# Patient Record
Sex: Male | Born: 1940
Health system: Southern US, Community
[De-identification: ages and names within clinical notes are randomized; demographics above are authoritative.]

## PROBLEM LIST (undated history)

## (undated) DIAGNOSIS — J301 Allergic rhinitis due to pollen: Secondary | ICD-10-CM

## (undated) DIAGNOSIS — N529 Male erectile dysfunction, unspecified: Secondary | ICD-10-CM

## (undated) DIAGNOSIS — R609 Edema, unspecified: Secondary | ICD-10-CM

## (undated) DIAGNOSIS — K635 Polyp of colon: Secondary | ICD-10-CM

## (undated) DIAGNOSIS — H9190 Unspecified hearing loss, unspecified ear: Secondary | ICD-10-CM

## (undated) DIAGNOSIS — I10 Essential (primary) hypertension: Secondary | ICD-10-CM

## (undated) DIAGNOSIS — D649 Anemia, unspecified: Secondary | ICD-10-CM

## (undated) DIAGNOSIS — M1990 Unspecified osteoarthritis, unspecified site: Secondary | ICD-10-CM

## (undated) DIAGNOSIS — E039 Hypothyroidism, unspecified: Secondary | ICD-10-CM

## (undated) DIAGNOSIS — R7989 Other specified abnormal findings of blood chemistry: Secondary | ICD-10-CM

## (undated) DIAGNOSIS — N486 Induration penis plastica: Secondary | ICD-10-CM

## (undated) DIAGNOSIS — N059 Unspecified nephritic syndrome with unspecified morphologic changes: Secondary | ICD-10-CM

## (undated) DIAGNOSIS — D126 Benign neoplasm of colon, unspecified: Secondary | ICD-10-CM

## (undated) DIAGNOSIS — E7800 Pure hypercholesterolemia, unspecified: Secondary | ICD-10-CM

## (undated) DIAGNOSIS — J449 Chronic obstructive pulmonary disease, unspecified: Secondary | ICD-10-CM

## (undated) DIAGNOSIS — J45909 Unspecified asthma, uncomplicated: Secondary | ICD-10-CM

## (undated) DIAGNOSIS — M720 Palmar fascial fibromatosis [Dupuytren]: Secondary | ICD-10-CM

## (undated) DIAGNOSIS — J309 Allergic rhinitis, unspecified: Secondary | ICD-10-CM

## (undated) DIAGNOSIS — M353 Polymyalgia rheumatica: Secondary | ICD-10-CM

## (undated) DIAGNOSIS — N183 Chronic kidney disease, stage 3 unspecified: Secondary | ICD-10-CM

## (undated) DIAGNOSIS — R011 Cardiac murmur, unspecified: Secondary | ICD-10-CM

## (undated) HISTORY — DX: Male erectile dysfunction, unspecified: N52.9

## (undated) HISTORY — PX: CATARACT EXTRACTION: SUR2

## (undated) HISTORY — PX: KNEE ARTHROSCOPY: SUR90

## (undated) HISTORY — PX: ACHILLES TENDON REPAIR: SUR1153

## (undated) HISTORY — DX: Anemia, unspecified: D64.9

## (undated) HISTORY — DX: Edema, unspecified: R60.9

## (undated) HISTORY — DX: Allergic rhinitis due to pollen: J30.1

## (undated) HISTORY — DX: Allergic rhinitis, unspecified: J30.9

## (undated) HISTORY — DX: Induration penis plastica: N48.6

## (undated) HISTORY — DX: Unspecified hearing loss, unspecified ear: H91.90

## (undated) HISTORY — DX: Chronic kidney disease, stage 3 unspecified: N18.30

## (undated) HISTORY — DX: Chronic obstructive pulmonary disease, unspecified: J44.9

## (undated) HISTORY — DX: Polymyalgia rheumatica: M35.3

## (undated) HISTORY — DX: Palmar fascial fibromatosis (dupuytren): M72.0

## (undated) HISTORY — DX: Hypothyroidism, unspecified: E03.9

## (undated) HISTORY — DX: Polyp of colon: K63.5

## (undated) HISTORY — PX: HERNIA REPAIR: SHX51

## (undated) HISTORY — PX: MOUTH SURGERY: SHX715

## (undated) HISTORY — PX: TONSILLECTOMY: SUR1361

## (undated) HISTORY — DX: Benign neoplasm of colon, unspecified: D12.6

## (undated) HISTORY — DX: Pure hypercholesterolemia, unspecified: E78.00

## (undated) HISTORY — DX: Other specified abnormal findings of blood chemistry: R79.89

## (undated) HISTORY — PX: ROTATOR CUFF REPAIR: SHX139

## (undated) HISTORY — PX: OTHER SURGICAL HISTORY: SHX169

---

## 2001-07-01 ENCOUNTER — Encounter (INDEPENDENT_AMBULATORY_CARE_PROVIDER_SITE_OTHER): Payer: Self-pay | Admitting: Specialist

## 2001-07-01 ENCOUNTER — Other Ambulatory Visit: Admission: RE | Admit: 2001-07-01 | Discharge: 2001-07-01 | Payer: Self-pay | Admitting: Gastroenterology

## 2004-06-24 DIAGNOSIS — N486 Induration penis plastica: Secondary | ICD-10-CM

## 2004-06-24 HISTORY — DX: Induration penis plastica: N48.6

## 2004-12-29 ENCOUNTER — Emergency Department (HOSPITAL_COMMUNITY): Admission: EM | Admit: 2004-12-29 | Discharge: 2004-12-29 | Payer: Self-pay | Admitting: Emergency Medicine

## 2005-03-27 ENCOUNTER — Encounter: Admission: RE | Admit: 2005-03-27 | Discharge: 2005-03-27 | Payer: Self-pay | Admitting: Family Medicine

## 2005-06-01 ENCOUNTER — Emergency Department (HOSPITAL_COMMUNITY): Admission: EM | Admit: 2005-06-01 | Discharge: 2005-06-02 | Payer: Self-pay | Admitting: Emergency Medicine

## 2005-06-06 ENCOUNTER — Ambulatory Visit: Payer: Self-pay | Admitting: Pulmonary Disease

## 2005-07-09 ENCOUNTER — Ambulatory Visit: Payer: Self-pay | Admitting: Pulmonary Disease

## 2005-08-08 ENCOUNTER — Ambulatory Visit: Payer: Self-pay | Admitting: Gastroenterology

## 2005-08-20 ENCOUNTER — Ambulatory Visit: Payer: Self-pay | Admitting: Pulmonary Disease

## 2005-09-12 ENCOUNTER — Ambulatory Visit: Payer: Self-pay | Admitting: Pulmonary Disease

## 2005-12-09 ENCOUNTER — Ambulatory Visit: Payer: Self-pay | Admitting: Gastroenterology

## 2006-04-16 ENCOUNTER — Encounter (INDEPENDENT_AMBULATORY_CARE_PROVIDER_SITE_OTHER): Payer: Self-pay | Admitting: *Deleted

## 2006-04-16 ENCOUNTER — Ambulatory Visit (HOSPITAL_COMMUNITY): Admission: RE | Admit: 2006-04-16 | Discharge: 2006-04-17 | Payer: Self-pay | Admitting: Otolaryngology

## 2006-09-07 ENCOUNTER — Emergency Department (HOSPITAL_COMMUNITY): Admission: EM | Admit: 2006-09-07 | Discharge: 2006-09-07 | Payer: Self-pay | Admitting: Emergency Medicine

## 2007-04-29 ENCOUNTER — Encounter (INDEPENDENT_AMBULATORY_CARE_PROVIDER_SITE_OTHER): Payer: Self-pay | Admitting: Family Medicine

## 2007-04-29 ENCOUNTER — Ambulatory Visit (HOSPITAL_COMMUNITY): Admission: RE | Admit: 2007-04-29 | Discharge: 2007-04-29 | Payer: Self-pay | Admitting: Family Medicine

## 2007-04-29 ENCOUNTER — Ambulatory Visit: Payer: Self-pay | Admitting: Vascular Surgery

## 2007-09-17 ENCOUNTER — Encounter: Admission: RE | Admit: 2007-09-17 | Discharge: 2007-09-17 | Payer: Self-pay | Admitting: Allergy and Immunology

## 2007-09-27 ENCOUNTER — Ambulatory Visit: Payer: Self-pay | Admitting: Internal Medicine

## 2007-09-29 LAB — CBC & DIFF AND RETIC
EOS%: 0.4 % (ref 0.0–7.0)
Eosinophils Absolute: 0.1 10*3/uL (ref 0.0–0.5)
IRF: 0.33 (ref 0.070–0.380)
MCH: 30.5 pg (ref 28.0–33.4)
MCV: 87.8 fL (ref 81.6–98.0)
MONO%: 7.4 % (ref 0.0–13.0)
NEUT#: 9.9 10*3/uL — ABNORMAL HIGH (ref 1.5–6.5)
RBC: 4.47 10*6/uL (ref 4.20–5.71)
RDW: 13.3 % (ref 11.2–14.6)
RETIC #: 60.8 10*3/uL (ref 31.8–103.9)
Retic %: 1.4 % (ref 0.7–2.3)
lymph#: 1.6 10*3/uL (ref 0.9–3.3)

## 2007-10-01 LAB — COMPREHENSIVE METABOLIC PANEL
AST: 27 U/L (ref 0–37)
Alkaline Phosphatase: 113 U/L (ref 39–117)
BUN: 25 mg/dL — ABNORMAL HIGH (ref 6–23)
Creatinine, Ser: 1.21 mg/dL (ref 0.40–1.50)

## 2007-10-01 LAB — PROTEIN ELECTROPHORESIS, SERUM
Albumin ELP: 62.2 % (ref 55.8–66.1)
Beta 2: 3.7 % (ref 3.2–6.5)
Beta Globulin: 6.3 % (ref 4.7–7.2)
Gamma Globulin: 10.5 % — ABNORMAL LOW (ref 11.1–18.8)

## 2007-10-01 LAB — FOLATE RBC: RBC Folate: 492 ng/mL (ref 180–600)

## 2007-10-01 LAB — IRON AND TIBC
%SAT: 17 % — ABNORMAL LOW (ref 20–55)
TIBC: 330 ug/dL (ref 215–435)
UIBC: 275 ug/dL

## 2007-10-01 LAB — FERRITIN: Ferritin: 81 ng/mL (ref 22–322)

## 2007-10-07 LAB — CBC WITH DIFFERENTIAL/PLATELET
Basophils Absolute: 0.2 10*3/uL — ABNORMAL HIGH (ref 0.0–0.1)
EOS%: 0.2 % (ref 0.0–7.0)
HGB: 13.9 g/dL (ref 13.0–17.1)
MCH: 30.4 pg (ref 28.0–33.4)
MCV: 88.8 fL (ref 81.6–98.0)
MONO%: 3.2 % (ref 0.0–13.0)
RDW: 13.3 % (ref 11.2–14.6)

## 2010-12-13 ENCOUNTER — Encounter: Payer: Self-pay | Admitting: Gastroenterology

## 2010-12-26 NOTE — Letter (Signed)
Summary: Colonoscopy Letter  [facility]  [address] [place], [place] [address]   Phone: [phone]  Fax: [phone]      December 13, 2010 MRN: [id number]   Shane Cook [address] [place], [place]  [address]   Dear Mr. Spickard,   According to your medical record, it is time for you to schedule a Colonoscopy. The American Cancer Society recommends this procedure as a method to detect early colon cancer. Patients with a family history of colon cancer, or a personal history of colon polyps or inflammatory bowel disease are at increased risk.  This letter has been generated based on the recommendations made at the time of your procedure. If you feel that in your particular situation this may no longer apply, please contact our office.  Please call our office at [phone] to schedule this appointment or to update your records at your earliest convenience.  Thank you for cooperating with [place] to provide you with the very best care possible.   Sincerely,  Rachael Fee, M.D.  [facility] Gastroenterology Division [phone]

## 2011-04-11 NOTE — Op Note (Signed)
NAME:  Shane Cook, Shane Cook              ACCOUNT NO.:  [id number]   MEDICAL RECORD NO.:  [id number]          PATIENT TYPE:  AMB   LOCATION:  SDS                          FACILITY:  [facility]   PHYSICIAN:  Kinnie Scales. Annalee Genta, M.D.DATE OF BIRTH:  09-Oct-1941   DATE OF PROCEDURE:  04/16/2006  DATE OF DISCHARGE:                                 OPERATIVE REPORT   PRE-AND-POSTOPERATIVE DIAGNOSIS AND INDICATIONS FOR SURGERY:  1.  Chronic sinusitis.  2.  Nasal polyposis.  3.  Deviated nasal septum.  4.  Inferior turbinate hypertrophy.   SURGICAL PROCEDURE:  1.  Bilateral endoscopic sinus surgery with the computer-assisted navigation      (Stealth consisting of bilateral total ethmoidectomy, bilateral      maxillary antrostomy with removal of diseased tissue, bilateral nasal      frontal recess exploration and left sphenoidotomy).  2.  Nasal septoplasty.  3.  Bilateral inferior turbinate reduction.   ANESTHESIA:  General endotracheal.   SURGEON:  Kinnie Scales. Annalee Genta, M.D.   COMPLICATIONS:  None.   BLOOD LOSS:  Approximately 200 mL.   DISPOSITION:  The patient transferred to the operating room to recovery room  in stable condition.   BRIEF HISTORY:  Shane Cook is a 70 year old white male who is referred for  evaluation of chronic sinusitis.  The patient has a history of pulmonary  dysfunction and reactive airway disease which is exacerbated by his  underlying sinus infections.  He has been treated with numerous courses of  antibiotics; and after aggressive antibiotic therapy and steroids; he is  referred our office for evaluation.  CT scanning was performed which showed  extensive polypoid disease, obstruction of the ostiomeatal complex, and  bilateral maxillary sinus infections.  The patient had a severely deviated  septum and turbinate hypertrophy.  He was, again, treated with a broad-  spectrum antibiotic for approximately 3 weeks, oral steroids, topical  steroids, and mucolytics; and  despite this aggressive therapy failed to have  any significant clinical improvement in his symptoms.  Followup CT scan  which included Stealth formatted CT scan for intraoperative computer-  assisted navigation was performed; and, again, similar findings with a  severely deviated septum, turbinate hypertrophy, and diffuse mucosal disease  consistent with polyposis involving the ethmoid, frontal, and maxillary  sinuses bilaterally.  Given the patient's history, physical examination and  findings, I recommended that we undertake bilateral endoscopic sinus  surgery.  The risk, benefits, and possible complications of the surgical  procedure were discussed in detail with the patient and his wife; and they  understood and concurred with our plan for surgery which was scheduled as  above.  Prior to surgery, cardiac clearance was obtained from his  cardiologist.  The patient underwent a stress test which was negative.  He  is cleared for surgery; and the operation was scheduled at [facility] OR under general anesthesia as an outpatient with overnight  admission.   SURGICAL PROCEDURE:  The patient brought to the operating room on  04/16/2006, placed in the supine position on the operating table.  General  endotracheal anesthesia  was established without difficulty; and the patient  was adequately anesthetized.  It is noted that he was examined and injected  with a total of 10 mL of 1% lidocaine 1:100,000 solution epinephrine  injected in a submucosal fashion on the lateral nasal wall, uncinate  process, middle turbinate, inferior turbinate, nasal septum, and transoral  sphenopalatine approach.  A total of 10 mL of 1% lidocaine and a 1:100,000  solution epinephrine was injected.  The patient's nose was then packed with  Afrin soaked cottonoid pledgets which were left in place for approximately  10 minutes to allow for vasoconstriction and hemostasis.  Surgical procedure  was begun  with the patient positioned on the operating table, prepped and  draped in a sterile fashion.  The stealth navigation headgear was applied  and anatomic and surgical landmarks were identified and confirmed.  The  device was used throughout the surgery for computer-assisted anatomic  localization.  A 0-degree endoscopy was performed.  The patient was found to  have a severely deviated septum with spurring to the right; and obstruction  of the right nasal cavity.  There was polypoid disease within the middle  meatus bilaterally.   Endoscopic sinus surgery was begun on the patient's left-hand side, using a  0-degree telescope and a straight microdebrider.  The middle turbinate was  gently medialized; and the uncinate process reflected medially.  By using  through cutting backbiting forceps, the uncinate process was resected; and  dissection was then carried out through the ethmoid bulla, and floor of the  ethmoid sinus from anterior to posterior.  There was dense polypoid material  within the ethmoid sinus which was cleared.  Ethmoid bone septations were  resected; and the posterior superior ethmoid sinus was identified.  Using a  45-degree telescope and then dissecting from posterior-to-anterior along the  roof of the ethmoid the diseased mucosa and bony septations were resected.  The nasal frontal recess was identified; and this completely occluded with  polypoid disease which was resected with the curved microdebrider.  The  navigation device was used throughout this portion of the case in order to  ensure safe dissection along the roof of the ethmoid sinus.   Attention was then turned to the lateral nasal wall where residual uncinate  process was resected.  A significant amount of polypoid disease within the  sinus was then resected with a curved microdebrider and the natural ostium  of the maxillary sinus was enlarged in an anterior-inferior posterior direction.  The left sphenoid sinus  was also involved with disease.  There  was significant obstruction at the ostium which was visualized, directly,  from anterior to posterior with the 0-degree telescope.  The posterior  ethmoid air cells were gently palpated.  The posterior aspect of the middle  turbinate was resected as was the inferior aspect of the superior turbinate  creating access to the sphenoethmoid recess.  The natural ostium was  identified and enlarged in an inferior and medial direction in a widely  patent left sphenoid ostium.   Nasal septoplasty was then performed.  A left anterior hemitransfixion  incision was created and a mucoperichondrial flap was elevated from anterior  to posterior on the patient's left-hand side.  Bony cartilaginous junction  was crossed in the midline and a mucoperiosteal flap was elevated along the  right.  The deviated bone and cartilage in the mi-and-posterior aspect of  the nasal septum were then resected.  The mid septal cartilage removed and  preserved.  These were returned to the mucoperichondrial pocket at the  conclusion of the surgical procedure.  Posterior deviated septum and a large  inferior septal bone spur were resected with a 4-mm osteotome.  Morselized  cartilage returned to the mucoperichondrial pocket and the flaps were  reapproximated with a 4-0 gut suture on a Keith needle in a horizontal  mattressing fashion.  At the conclusion of the surgical procedure, bilateral  Doyle nasal septal splints were placed after the application of Bactroban  ointment and sutured in position with a 3-0 Ethilon suture.   Right endoscopic sinus surgery was then undertaken.  The middle turbinate  was gently medialized.  A 0-degree scope was used to resect the uncinate  process with a through cutting backbiting forceps.  The anterior ethmoid  cells were identified and dissection was carried from anterior to posterior  through the ethmoid bulla, and the floor of the posterior ethmoid  region.  Again, there was significant polypoid disease in this location.  Dissection  then carried along with the roof of the ethmoid sinus with a 45-degree  telescope using a microdebrider and the stealth device, preserving the bony  lamina and resecting septations and diseased mucosa.  The nasal frontal  recess was identified under direct visualization using the stealth device.  The ostium was narrow; and there was significant polypoid disease, but using  the stealth and a microdebrider we were able to establish patency in the  right frontal sinus.   Attention was then turned to the lateral nasal wall where the natural ostium  of the maxillary sinus was completely occluded with diseased mucosa.  This  was resected with through cutting forceps and the maxillary sinus was  entered.  The ostium was enlarged in an anterior, inferior, and posterior  direction.  Thick mucus was removed from the posterior ethmoid region and sent to the laboratory for analysis for allergic fungal mucin.   Inferior turbinate reduction was then performed with the bipolar intramural  cautery set at 12 watts.  Two submucosal passes were made in each inferior  turbinate and the turbinates were adequately cauterized or outfractured to  make a more patent nasal cavity.  The anterior-inferior aspect of each  inferior turbinate was then partially resected, preserving the overlying  mucosa and removing the anterior turbinate bone.   The patient's nasal cavity was irrigated and suctioned.  The 0-degree  telescope was used to inspect front to back in each sinus and surgical  debris was resected; creating a widely patent and clean sinus cavity with no  active bleeding.  A 50/50 mix of Kenalog 40 and Bactroban cream was then  instilled into each of the sinuses, frontal ethmoid and maxillary sinuses  bilaterally; and bilateral Kennedy sinus packs were placed in the common  ethmoid cavity bilaterally; and hydrated with  sterile saline.  The patient's  nasal cavity and nasopharynx were irrigated and suctioned.  Orogastric tube  was passed and the stomach contents were aspirated.  The patient was  awakened from his anesthetic, extubated; and was then transferred from the  operating room to the recovery in stable condition.  No complications.  Blood loss approximately 200 mL.           ______________________________  Kinnie Scales. Annalee Genta, M.D.     DLS/MEDQ  D:  96/02/5408  T:  04/16/2006  Job:  [id number]

## 2011-12-26 DIAGNOSIS — H35379 Puckering of macula, unspecified eye: Secondary | ICD-10-CM | POA: Diagnosis not present

## 2011-12-26 DIAGNOSIS — H01009 Unspecified blepharitis unspecified eye, unspecified eyelid: Secondary | ICD-10-CM | POA: Diagnosis not present

## 2012-02-05 DIAGNOSIS — I10 Essential (primary) hypertension: Secondary | ICD-10-CM | POA: Diagnosis not present

## 2012-02-05 DIAGNOSIS — E7800 Pure hypercholesterolemia, unspecified: Secondary | ICD-10-CM | POA: Diagnosis not present

## 2012-02-05 DIAGNOSIS — M353 Polymyalgia rheumatica: Secondary | ICD-10-CM | POA: Diagnosis not present

## 2012-02-09 DIAGNOSIS — Z0000 Encounter for general adult medical examination without abnormal findings: Secondary | ICD-10-CM | POA: Diagnosis not present

## 2012-02-09 DIAGNOSIS — E7800 Pure hypercholesterolemia, unspecified: Secondary | ICD-10-CM | POA: Diagnosis not present

## 2012-02-09 DIAGNOSIS — Z79899 Other long term (current) drug therapy: Secondary | ICD-10-CM | POA: Diagnosis not present

## 2012-03-31 DIAGNOSIS — R972 Elevated prostate specific antigen [PSA]: Secondary | ICD-10-CM | POA: Diagnosis not present

## 2012-03-31 DIAGNOSIS — E291 Testicular hypofunction: Secondary | ICD-10-CM | POA: Diagnosis not present

## 2012-04-07 DIAGNOSIS — E291 Testicular hypofunction: Secondary | ICD-10-CM | POA: Diagnosis not present

## 2012-04-07 DIAGNOSIS — R972 Elevated prostate specific antigen [PSA]: Secondary | ICD-10-CM | POA: Diagnosis not present

## 2012-04-07 DIAGNOSIS — N486 Induration penis plastica: Secondary | ICD-10-CM | POA: Diagnosis not present

## 2012-04-07 DIAGNOSIS — N529 Male erectile dysfunction, unspecified: Secondary | ICD-10-CM | POA: Diagnosis not present

## 2012-04-21 DIAGNOSIS — E291 Testicular hypofunction: Secondary | ICD-10-CM | POA: Diagnosis not present

## 2012-05-05 DIAGNOSIS — Z0000 Encounter for general adult medical examination without abnormal findings: Secondary | ICD-10-CM | POA: Diagnosis not present

## 2012-05-05 DIAGNOSIS — Z79899 Other long term (current) drug therapy: Secondary | ICD-10-CM | POA: Diagnosis not present

## 2012-05-05 DIAGNOSIS — E7800 Pure hypercholesterolemia, unspecified: Secondary | ICD-10-CM | POA: Diagnosis not present

## 2012-05-11 DIAGNOSIS — I10 Essential (primary) hypertension: Secondary | ICD-10-CM | POA: Diagnosis not present

## 2012-05-11 DIAGNOSIS — R7301 Impaired fasting glucose: Secondary | ICD-10-CM | POA: Diagnosis not present

## 2012-05-11 DIAGNOSIS — E7800 Pure hypercholesterolemia, unspecified: Secondary | ICD-10-CM | POA: Diagnosis not present

## 2012-05-11 DIAGNOSIS — D649 Anemia, unspecified: Secondary | ICD-10-CM | POA: Diagnosis not present

## 2012-05-21 DIAGNOSIS — E291 Testicular hypofunction: Secondary | ICD-10-CM | POA: Diagnosis not present

## 2012-08-06 ENCOUNTER — Encounter: Payer: Self-pay | Admitting: Gastroenterology

## 2012-08-11 DIAGNOSIS — E291 Testicular hypofunction: Secondary | ICD-10-CM | POA: Diagnosis not present

## 2012-08-13 ENCOUNTER — Encounter: Payer: Self-pay | Admitting: Gastroenterology

## 2012-08-20 ENCOUNTER — Encounter: Payer: Self-pay | Admitting: Gastroenterology

## 2012-08-27 ENCOUNTER — Ambulatory Visit (AMBULATORY_SURGERY_CENTER): Payer: Medicare Other

## 2012-08-27 VITALS — Ht 70.0 in | Wt 200.0 lb

## 2012-08-27 DIAGNOSIS — Z860100 Personal history of colon polyps, unspecified: Secondary | ICD-10-CM

## 2012-08-27 MED ORDER — MOVIPREP 100 G PO SOLR: 1.0000 | Freq: Once | ORAL | Status: DC

## 2012-09-01 DIAGNOSIS — J45901 Unspecified asthma with (acute) exacerbation: Secondary | ICD-10-CM | POA: Diagnosis not present

## 2012-09-01 DIAGNOSIS — J310 Chronic rhinitis: Secondary | ICD-10-CM | POA: Diagnosis not present

## 2012-09-01 DIAGNOSIS — J441 Chronic obstructive pulmonary disease with (acute) exacerbation: Secondary | ICD-10-CM | POA: Diagnosis not present

## 2012-09-01 DIAGNOSIS — J328 Other chronic sinusitis: Secondary | ICD-10-CM | POA: Diagnosis not present

## 2012-09-03 ENCOUNTER — Ambulatory Visit (AMBULATORY_SURGERY_CENTER): Payer: Medicare Other | Admitting: Gastroenterology

## 2012-09-03 ENCOUNTER — Encounter: Payer: Self-pay | Admitting: Gastroenterology

## 2012-09-03 VITALS — BP 121/70 | HR 65 | Temp 98.5°F | Resp 36 | Ht 70.0 in | Wt 200.0 lb

## 2012-09-03 DIAGNOSIS — K5730 Diverticulosis of large intestine without perforation or abscess without bleeding: Secondary | ICD-10-CM

## 2012-09-03 DIAGNOSIS — Z86010 Personal history of colonic polyps: Secondary | ICD-10-CM

## 2012-09-03 MED ORDER — SODIUM CHLORIDE 0.9 % IV SOLN: 500.0000 mL | INTRAVENOUS | Status: DC

## 2012-09-03 NOTE — Op Note (Signed)
[facility] [address] [place], [address]   COLONOSCOPY PROCEDURE REPORT  PATIENT: [name], Shane [place].  MR#: [id number] BIRTHDATE: [birth year] , 71  yrs. old GENDER: Male ENDOSCOPIST: Rachael Fee, MD PROCEDURE DATE:  09/03/2012 PROCEDURE:   Colonoscopy, diagnostic ASA CLASS:   Class III INDICATIONS:patient's personal history of adenomatous colon polyps.  MEDICATIONS: Fentanyl 50 mcg IV and Versed 5 mg IV  DESCRIPTION OF PROCEDURE:   After the risks benefits and alternatives of the procedure were thoroughly explained, informed consent was obtained.  A digital rectal exam revealed no abnormalities of the rectum.   The LB CF-[id number]  endoscope was introduced through the anus and advanced to the cecum, which was identified by both the appendix and ileocecal valve. No adverse events experienced.   The quality of the prep was good, using MoviPrep  The instrument was then slowly withdrawn as the colon was fully examined.   COLON FINDINGS: Mild diverticulosis was noted in the sigmoid colon. Retroflexed views revealed no abnormalities. The time to cecum=1 minutes 07 seconds.  Withdrawal time=8 minutes 53 seconds.  The scope was withdrawn and the procedure completed. COMPLICATIONS: There were no complications.  ENDOSCOPIC IMPRESSION: Mild diverticulosis was noted in the sigmoid colon No polyps or cancers  RECOMMENDATIONS: Given your personal history of adenomatous (pre-cancerous) polyps, you will need a repeat colonoscopy in 5 years.   eSigned:  Rachael Fee, MD 09/03/2012 2:27 PM    cc: Duane Lope, MD

## 2012-09-03 NOTE — Progress Notes (Signed)
Patient did not experience any of the following events: a burn prior to discharge; a fall within the facility; wrong site/side/patient/procedure/implant event; or a hospital transfer or hospital admission upon discharge from the facility. (G8907) Patient did not have preoperative order for IV antibiotic SSI prophylaxis. (G8918)  

## 2012-09-03 NOTE — Patient Instructions (Addendum)
Discharge instructions given with verbal understanding. Handouts on diverticulosis and a high fiber diet given. Resume previous medications.YOU HAD AN ENDOSCOPIC PROCEDURE TODAY AT THE [facility]: Refer to the procedure report that was given to you for any specific questions about what was found during the examination.  If the procedure report does not answer your questions, please call your gastroenterologist to clarify.  If you requested that your care partner not be given the details of your procedure findings, then the procedure report has been included in a sealed envelope for you to review at your convenience later.  YOU SHOULD EXPECT: Some feelings of bloating in the abdomen. Passage of more gas than usual.  Walking can help get rid of the air that was put into your GI tract during the procedure and reduce the bloating. If you had a lower endoscopy (such as a colonoscopy or flexible sigmoidoscopy) you may notice spotting of blood in your stool or on the toilet paper. If you underwent a bowel prep for your procedure, then you may not have a normal bowel movement for a few days.  DIET: Your first meal following the procedure should be a light meal and then it is ok to progress to your normal diet.  A half-sandwich or bowl of soup is an example of a good first meal.  Heavy or fried foods are harder to digest and may make you feel nauseous or bloated.  Likewise meals heavy in dairy and vegetables can cause extra gas to form and this can also increase the bloating.  Drink plenty of fluids but you should avoid alcoholic beverages for 24 hours.  ACTIVITY: Your care partner should take you home directly after the procedure.  You should plan to take it easy, moving slowly for the rest of the day.  You can resume normal activity the day after the procedure however you should NOT DRIVE or use heavy machinery for 24 hours (because of the sedation medicines used during the test).    SYMPTOMS TO  REPORT IMMEDIATELY: A gastroenterologist can be reached at any hour.  During normal business hours, 8:30 AM to 5:00 PM Monday through Friday, call [phone].  After hours and on weekends, please call the GI answering service at [phone] who will take a message and have the physician on call contact you.   Following lower endoscopy (colonoscopy or flexible sigmoidoscopy):  Excessive amounts of blood in the stool  Significant tenderness or worsening of abdominal pains  Swelling of the abdomen that is new, acute  Fever of 100F or higher  FOLLOW UP: If any biopsies were taken you will be contacted by phone or by letter within the next 1-3 weeks.  Call your gastroenterologist if you have not heard about the biopsies in 3 weeks.  Our staff will call the home number listed on your records the next business day following your procedure to check on you and address any questions or concerns that you may have at that time regarding the information given to you following your procedure. This is a courtesy call and so if there is no answer at the home number and we have not heard from you through the emergency physician on call, we will assume that you have returned to your regular daily activities without incident.  SIGNATURES/CONFIDENTIALITY: You and/or your care partner have signed paperwork which will be entered into your electronic medical record.  These signatures attest to the fact that that the information above on your   After Visit Summary has been reviewed and is understood.  Full responsibility of the confidentiality of this discharge information lies with you and/or your care-partner.   

## 2012-09-06 ENCOUNTER — Telehealth: Payer: Self-pay

## 2012-09-06 DIAGNOSIS — H02109 Unspecified ectropion of unspecified eye, unspecified eyelid: Secondary | ICD-10-CM | POA: Diagnosis not present

## 2012-09-06 DIAGNOSIS — H04209 Unspecified epiphora, unspecified lacrimal gland: Secondary | ICD-10-CM | POA: Diagnosis not present

## 2012-09-06 DIAGNOSIS — H0489 Other disorders of lacrimal system: Secondary | ICD-10-CM | POA: Diagnosis not present

## 2012-09-06 DIAGNOSIS — H0289 Other specified disorders of eyelid: Secondary | ICD-10-CM | POA: Diagnosis not present

## 2012-09-06 NOTE — Telephone Encounter (Signed)
Left message

## 2012-09-24 ENCOUNTER — Other Ambulatory Visit: Payer: Self-pay | Admitting: Gastroenterology

## 2012-09-28 DIAGNOSIS — E349 Endocrine disorder, unspecified: Secondary | ICD-10-CM

## 2012-09-28 DIAGNOSIS — E785 Hyperlipidemia, unspecified: Secondary | ICD-10-CM

## 2012-09-28 DIAGNOSIS — I10 Essential (primary) hypertension: Secondary | ICD-10-CM

## 2012-09-28 DIAGNOSIS — M1990 Unspecified osteoarthritis, unspecified site: Secondary | ICD-10-CM

## 2012-09-28 HISTORY — DX: Unspecified osteoarthritis, unspecified site: M19.90

## 2012-09-28 HISTORY — DX: Hyperlipidemia, unspecified: E78.5

## 2012-09-28 HISTORY — DX: Essential (primary) hypertension: I10

## 2012-09-28 HISTORY — DX: Endocrine disorder, unspecified: E34.9

## 2012-10-11 DIAGNOSIS — H02109 Unspecified ectropion of unspecified eye, unspecified eyelid: Secondary | ICD-10-CM | POA: Diagnosis not present

## 2012-10-11 DIAGNOSIS — H04209 Unspecified epiphora, unspecified lacrimal gland: Secondary | ICD-10-CM | POA: Diagnosis not present

## 2012-10-19 DIAGNOSIS — H02109 Unspecified ectropion of unspecified eye, unspecified eyelid: Secondary | ICD-10-CM | POA: Insufficient documentation

## 2012-10-19 HISTORY — DX: Unspecified ectropion of unspecified eye, unspecified eyelid: H02.109

## 2012-11-03 DIAGNOSIS — D649 Anemia, unspecified: Secondary | ICD-10-CM | POA: Diagnosis not present

## 2012-11-03 DIAGNOSIS — I10 Essential (primary) hypertension: Secondary | ICD-10-CM | POA: Diagnosis not present

## 2012-11-03 DIAGNOSIS — E7800 Pure hypercholesterolemia, unspecified: Secondary | ICD-10-CM | POA: Diagnosis not present

## 2012-11-03 DIAGNOSIS — R7301 Impaired fasting glucose: Secondary | ICD-10-CM | POA: Diagnosis not present

## 2012-11-10 DIAGNOSIS — I10 Essential (primary) hypertension: Secondary | ICD-10-CM | POA: Diagnosis not present

## 2012-11-10 DIAGNOSIS — E7800 Pure hypercholesterolemia, unspecified: Secondary | ICD-10-CM | POA: Diagnosis not present

## 2012-11-10 DIAGNOSIS — R7301 Impaired fasting glucose: Secondary | ICD-10-CM | POA: Diagnosis not present

## 2012-11-22 DIAGNOSIS — R972 Elevated prostate specific antigen [PSA]: Secondary | ICD-10-CM | POA: Diagnosis not present

## 2012-11-29 DIAGNOSIS — R972 Elevated prostate specific antigen [PSA]: Secondary | ICD-10-CM | POA: Diagnosis not present

## 2012-11-29 DIAGNOSIS — N529 Male erectile dysfunction, unspecified: Secondary | ICD-10-CM | POA: Diagnosis not present

## 2012-11-29 DIAGNOSIS — E291 Testicular hypofunction: Secondary | ICD-10-CM | POA: Diagnosis not present

## 2012-11-29 DIAGNOSIS — N486 Induration penis plastica: Secondary | ICD-10-CM | POA: Diagnosis not present

## 2012-11-30 DIAGNOSIS — H02409 Unspecified ptosis of unspecified eyelid: Secondary | ICD-10-CM | POA: Diagnosis not present

## 2012-11-30 DIAGNOSIS — Z9889 Other specified postprocedural states: Secondary | ICD-10-CM | POA: Diagnosis not present

## 2012-11-30 DIAGNOSIS — H0230 Blepharochalasis unspecified eye, unspecified eyelid: Secondary | ICD-10-CM | POA: Diagnosis not present

## 2012-11-30 DIAGNOSIS — Z09 Encounter for follow-up examination after completed treatment for conditions other than malignant neoplasm: Secondary | ICD-10-CM | POA: Diagnosis not present

## 2012-11-30 HISTORY — DX: Blepharochalasis unspecified eye, unspecified eyelid: H02.30

## 2012-11-30 HISTORY — DX: Unspecified ptosis of unspecified eyelid: H02.409

## 2013-02-02 DIAGNOSIS — R7301 Impaired fasting glucose: Secondary | ICD-10-CM | POA: Diagnosis not present

## 2013-02-02 DIAGNOSIS — I10 Essential (primary) hypertension: Secondary | ICD-10-CM | POA: Diagnosis not present

## 2013-02-02 DIAGNOSIS — E7800 Pure hypercholesterolemia, unspecified: Secondary | ICD-10-CM | POA: Diagnosis not present

## 2013-02-09 DIAGNOSIS — Z0000 Encounter for general adult medical examination without abnormal findings: Secondary | ICD-10-CM | POA: Diagnosis not present

## 2013-02-09 DIAGNOSIS — R413 Other amnesia: Secondary | ICD-10-CM | POA: Diagnosis not present

## 2013-02-09 DIAGNOSIS — I10 Essential (primary) hypertension: Secondary | ICD-10-CM | POA: Diagnosis not present

## 2013-02-09 DIAGNOSIS — E7800 Pure hypercholesterolemia, unspecified: Secondary | ICD-10-CM | POA: Diagnosis not present

## 2013-02-18 DIAGNOSIS — M25569 Pain in unspecified knee: Secondary | ICD-10-CM | POA: Diagnosis not present

## 2013-03-04 DIAGNOSIS — E291 Testicular hypofunction: Secondary | ICD-10-CM | POA: Diagnosis not present

## 2013-03-08 DIAGNOSIS — J328 Other chronic sinusitis: Secondary | ICD-10-CM | POA: Diagnosis not present

## 2013-03-08 DIAGNOSIS — J310 Chronic rhinitis: Secondary | ICD-10-CM | POA: Diagnosis not present

## 2013-03-08 DIAGNOSIS — J45901 Unspecified asthma with (acute) exacerbation: Secondary | ICD-10-CM | POA: Diagnosis not present

## 2013-03-08 DIAGNOSIS — J441 Chronic obstructive pulmonary disease with (acute) exacerbation: Secondary | ICD-10-CM | POA: Diagnosis not present

## 2013-04-11 DIAGNOSIS — E291 Testicular hypofunction: Secondary | ICD-10-CM | POA: Diagnosis not present

## 2013-05-23 DIAGNOSIS — E291 Testicular hypofunction: Secondary | ICD-10-CM | POA: Diagnosis not present

## 2013-05-23 DIAGNOSIS — R972 Elevated prostate specific antigen [PSA]: Secondary | ICD-10-CM | POA: Diagnosis not present

## 2013-05-30 DIAGNOSIS — N529 Male erectile dysfunction, unspecified: Secondary | ICD-10-CM | POA: Diagnosis not present

## 2013-05-30 DIAGNOSIS — E291 Testicular hypofunction: Secondary | ICD-10-CM | POA: Diagnosis not present

## 2013-07-18 DIAGNOSIS — M25569 Pain in unspecified knee: Secondary | ICD-10-CM | POA: Diagnosis not present

## 2013-08-15 ENCOUNTER — Other Ambulatory Visit: Payer: Self-pay | Admitting: Orthopaedic Surgery

## 2013-08-18 ENCOUNTER — Encounter (HOSPITAL_COMMUNITY): Payer: Self-pay | Admitting: Pharmacy Technician

## 2013-08-23 ENCOUNTER — Encounter (HOSPITAL_COMMUNITY): Payer: Self-pay

## 2013-08-23 ENCOUNTER — Encounter (HOSPITAL_COMMUNITY)
Admission: RE | Admit: 2013-08-23 | Discharge: 2013-08-23 | Disposition: A | Discharge status: Home / Self Care | Payer: Medicare Other | Source: Ambulatory Visit | Attending: Orthopaedic Surgery | Admitting: Orthopaedic Surgery

## 2013-08-23 DIAGNOSIS — Z01810 Encounter for preprocedural cardiovascular examination: Secondary | ICD-10-CM | POA: Insufficient documentation

## 2013-08-23 DIAGNOSIS — Z01812 Encounter for preprocedural laboratory examination: Secondary | ICD-10-CM | POA: Diagnosis not present

## 2013-08-23 DIAGNOSIS — Z01818 Encounter for other preprocedural examination: Secondary | ICD-10-CM | POA: Insufficient documentation

## 2013-08-23 HISTORY — DX: Unspecified osteoarthritis, unspecified site: M19.90

## 2013-08-23 HISTORY — DX: Cardiac murmur, unspecified: R01.1

## 2013-08-23 HISTORY — DX: Unspecified nephritic syndrome with unspecified morphologic changes: N05.9

## 2013-08-23 HISTORY — DX: Unspecified asthma, uncomplicated: J45.909

## 2013-08-23 HISTORY — DX: Essential (primary) hypertension: I10

## 2013-08-23 LAB — CBC WITH DIFFERENTIAL/PLATELET
Basophils Absolute: 0 10*3/uL (ref 0.0–0.1)
Basophils Relative: 0 % (ref 0–1)
Eosinophils Absolute: 0.3 10*3/uL (ref 0.0–0.7)
Eosinophils Relative: 4 % (ref 0–5)
HCT: 51.8 % (ref 39.0–52.0)
Hemoglobin: 18.1 g/dL — ABNORMAL HIGH (ref 13.0–17.0)
MCH: 31.6 pg (ref 26.0–34.0)
MCHC: 34.9 g/dL (ref 30.0–36.0)
MCV: 90.4 fL (ref 78.0–100.0)
Monocytes Absolute: 0.8 10*3/uL (ref 0.1–1.0)
Monocytes Relative: 11 % (ref 3–12)
Neutro Abs: 5 10*3/uL (ref 1.7–7.7)
RDW: 13.7 % (ref 11.5–15.5)
WBC: 7.9 10*3/uL (ref 4.0–10.5)

## 2013-08-23 LAB — URINALYSIS, ROUTINE W REFLEX MICROSCOPIC
Bilirubin Urine: NEGATIVE
Glucose, UA: NEGATIVE mg/dL
Hgb urine dipstick: NEGATIVE
Leukocytes, UA: NEGATIVE
Protein, ur: NEGATIVE mg/dL
Specific Gravity, Urine: 1.008 (ref 1.005–1.030)
Urobilinogen, UA: 0.2 mg/dL (ref 0.0–1.0)
pH: 7 (ref 5.0–8.0)

## 2013-08-23 LAB — BASIC METABOLIC PANEL
BUN: 16 mg/dL (ref 6–23)
CO2: 31 mEq/L (ref 19–32)
Chloride: 100 mEq/L (ref 96–112)
Creatinine, Ser: 1.3 mg/dL (ref 0.50–1.35)
GFR calc Af Amer: 61 mL/min — ABNORMAL LOW (ref >90)
Glucose, Bld: 99 mg/dL (ref 70–99)
Sodium: 138 mEq/L (ref 135–145)

## 2013-08-23 LAB — APTT: aPTT: 30 seconds (ref 24–37)

## 2013-08-23 LAB — TYPE AND SCREEN: ABO/RH(D): O NEG

## 2013-08-23 LAB — ABO/RH: ABO/RH(D): O NEG

## 2013-08-23 LAB — SURGICAL PCR SCREEN
MRSA, PCR: NEGATIVE
Staphylococcus aureus: NEGATIVE

## 2013-08-23 NOTE — Pre-Procedure Instructions (Addendum)
[facility] Jorge Barno  08/23/2013   Your procedure is scheduled on:  08/30/13  Report to [facility] short stay admitting at 530* AM.  Call this number if you have problems the morning of surgery: [phone]   Remember:   Do not eat food or drink liquids after midnight.   Take these medicines the morning of surgery with A SIP OF WATER: inhaler if needed   Do not wear jewelry, make-up or nail polish.  Do not wear lotions, powders, or perfumes. You may wear deodorant.  Do not shave 48 hours prior to surgery. Men may shave face and neck.  Do not bring valuables to the hospital.  [facility] is not responsible                  for any belongings or valuables.               Contacts, dentures or bridgework may not be worn into surgery.  Leave suitcase in the car. After surgery it may be brought to your room.  For patients admitted to the hospital, discharge time is determined by your                treatment team.               Patients discharged the day of surgery will not be allowed to drive  home.  Name and phone number of your driver:   Special Instructions: Incentive Spirometry - Practice and bring it with you on the day of surgery. Shower using CHG 2 nights before surgery and the night before surgery.  If you shower the day of surgery use CHG.  Use special wash - you have one bottle of CHG for all showers.  You should use approximately 1/3 of the bottle for each shower.   Please read over the following fact sheets that you were given: Pain Booklet, Coughing and Deep Breathing, Blood Transfusion Information, Total Joint Packet, MRSA Information and Surgical Site Infection Prevention

## 2013-08-26 NOTE — H&P (Signed)
TOTAL KNEE ADMISSION H&P  Patient is being admitted for left total knee arthroplasty.  Subjective:  Chief Complaint:left knee pain.  HPI: Shane Cook, 72 y.o. male, has a history of pain and functional disability in the left knee due to arthritis and has failed non-surgical conservative treatments for greater than 12 weeks to includecorticosteriod injections, flexibility and strengthening excercises, supervised PT with diminished ADL's post treatment, weight reduction as appropriate and activity modification.  Onset of symptoms was gradual, starting 10 years ago with gradually worsening course since that time. The patient noted prior procedures on the knee to include  arthroscopy on the left knee(s).  Patient currently rates pain in the left knee(s) at 9 out of 10 with activity. Patient has night pain, worsening of pain with activity and weight bearing, pain that interferes with activities of daily living and pain with passive range of motion.  Patient has evidence of subchondral sclerosis, periarticular osteophytes and joint space narrowing by imaging studies. This patient has had previous knee arthroscopy. There is no active infection.  There are no active problems to display for this patient.  Past Medical History  Diagnosis Date  . Hypercholesterolemia   . Low testosterone   . Edema     takes hydrochlorothiazide  . Hypertension   . Asthma     no problems recently  . Heart murmur     hx yrs ago  . Nephritis     72 yrs old  hospitalized. no problem since  . Arthritis     Past Surgical History  Procedure Laterality Date  . Hernia repair    . Achilles tendon repair Right   . Mouth surgery      cyst 72 yrs old ?ethmoid  . Rotator cuff repair      bilat 20 years apart  . Knee arthroscopy      left  . Cataract extraction      bilateral  . Tonsillectomy    . Peyronies  08?    penis bent/    No prescriptions prior to admission   Allergies  Allergen Reactions  . Sulfa  Antibiotics     Questionable allergy  . Amoxicillin Rash    History  Substance Use Topics  . Smoking status: Former Smoker -- 2.00 packs/day for 21 years    Types: Cigarettes    Quit date: 05/28/1975  . Smokeless tobacco: Never Used  . Alcohol Use: No    Family History  Problem Relation Age of Onset  . Colon cancer Neg Hx      Review of Systems  Constitutional: Negative.   HENT: Negative.   Eyes: Negative.   Respiratory: Negative.   Cardiovascular: Negative.   Gastrointestinal: Negative.   Genitourinary: Negative.   Musculoskeletal: Positive for joint pain.  Skin: Negative.   Neurological: Negative.   Endo/Heme/Allergies: Negative.   Psychiatric/Behavioral: Negative.     Objective:  Physical Exam  Constitutional: He appears well-nourished.  HENT:  Head: Atraumatic.  Eyes: EOM are normal.  Neck: Neck supple.  Cardiovascular: Normal rate.   Respiratory: Effort normal.  GI: Bowel sounds are normal.  Musculoskeletal:  Left knee exam motion 0-1 25.  No effusion.  Left side with old arthroscopy portals.  Pain along the medial joint line.  Pain at this level as severe.  Neurological: He is alert.  Skin: Skin is warm.  Psychiatric: He has a normal mood and affect.    Vital signs in last 24 hours:    Labs:  Estimated body mass index is 28.70 kg/(m^2) as calculated from the following:   Height as of 09/03/12: 5\' 10"  (1.778 m).   Weight as of 09/03/12: 90.719 kg (200 lb).   Imaging Review Plain radiographs demonstrate severe degenerative joint disease of the left knee(s). The overall alignment isneutral. The bone quality appears to be good for age and reported activity level.  Assessment/Plan:  End stage arthritis, left knee   The patient history, physical examination, clinical judgment of the provider and imaging studies are consistent with end stage degenerative joint disease of the left knee(s) and total knee arthroplasty is deemed medically necessary. The  treatment options including medical management, injection therapy arthroscopy and arthroplasty were discussed at length. The risks and benefits of total knee arthroplasty were presented and reviewed. The risks due to aseptic loosening, infection, stiffness, patella tracking problems, thromboembolic complications and other imponderables were discussed. The patient acknowledged the explanation, agreed to proceed with the plan and consent was signed. Patient is being admitted for inpatient treatment for surgery, pain control, PT, OT, prophylactic antibiotics, VTE prophylaxis, progressive ambulation and ADL's and discharge planning. The patient is planning to be discharged home with home health services

## 2013-08-29 MED ORDER — CEFAZOLIN SODIUM-DEXTROSE 2-3 GM-% IV SOLR
2.0000 g | INTRAVENOUS | Status: AC
  Administered 2013-08-30: 2 g via INTRAVENOUS
  Filled 2013-08-29: qty 50

## 2013-08-30 ENCOUNTER — Inpatient Hospital Stay (HOSPITAL_COMMUNITY): Payer: Medicare Other | Admitting: Anesthesiology

## 2013-08-30 ENCOUNTER — Inpatient Hospital Stay (HOSPITAL_COMMUNITY)
Admission: RE | Admit: 2013-08-30 | Discharge: 2013-09-01 | DRG: 470 | Disposition: A | Discharge status: Home Health | Payer: Medicare Other | Source: Ambulatory Visit | Attending: Orthopaedic Surgery | Admitting: Orthopaedic Surgery

## 2013-08-30 ENCOUNTER — Encounter (HOSPITAL_COMMUNITY)
Admission: RE | Disposition: A | Discharge status: Home Health | Payer: Self-pay | Source: Ambulatory Visit | Attending: Orthopaedic Surgery

## 2013-08-30 ENCOUNTER — Encounter (HOSPITAL_COMMUNITY): Payer: Self-pay | Admitting: Surgery

## 2013-08-30 ENCOUNTER — Encounter (HOSPITAL_COMMUNITY): Payer: Self-pay | Admitting: Anesthesiology

## 2013-08-30 DIAGNOSIS — Z7982 Long term (current) use of aspirin: Secondary | ICD-10-CM

## 2013-08-30 DIAGNOSIS — M1710 Unilateral primary osteoarthritis, unspecified knee: Principal | ICD-10-CM | POA: Diagnosis present

## 2013-08-30 DIAGNOSIS — G8918 Other acute postprocedural pain: Secondary | ICD-10-CM | POA: Diagnosis not present

## 2013-08-30 DIAGNOSIS — E7800 Pure hypercholesterolemia, unspecified: Secondary | ICD-10-CM | POA: Diagnosis present

## 2013-08-30 DIAGNOSIS — Z87891 Personal history of nicotine dependence: Secondary | ICD-10-CM | POA: Diagnosis not present

## 2013-08-30 DIAGNOSIS — I10 Essential (primary) hypertension: Secondary | ICD-10-CM | POA: Diagnosis not present

## 2013-08-30 DIAGNOSIS — M1712 Unilateral primary osteoarthritis, left knee: Secondary | ICD-10-CM

## 2013-08-30 DIAGNOSIS — Z9849 Cataract extraction status, unspecified eye: Secondary | ICD-10-CM | POA: Diagnosis not present

## 2013-08-30 DIAGNOSIS — Z882 Allergy status to sulfonamides status: Secondary | ICD-10-CM | POA: Diagnosis not present

## 2013-08-30 DIAGNOSIS — IMO0002 Reserved for concepts with insufficient information to code with codable children: Secondary | ICD-10-CM | POA: Diagnosis not present

## 2013-08-30 DIAGNOSIS — J45909 Unspecified asthma, uncomplicated: Secondary | ICD-10-CM | POA: Diagnosis not present

## 2013-08-30 DIAGNOSIS — Z79899 Other long term (current) drug therapy: Secondary | ICD-10-CM | POA: Diagnosis not present

## 2013-08-30 DIAGNOSIS — M25569 Pain in unspecified knee: Secondary | ICD-10-CM | POA: Diagnosis not present

## 2013-08-30 DIAGNOSIS — Z881 Allergy status to other antibiotic agents status: Secondary | ICD-10-CM | POA: Diagnosis not present

## 2013-08-30 HISTORY — PX: TOTAL KNEE ARTHROPLASTY: SHX125

## 2013-08-30 HISTORY — DX: Unilateral primary osteoarthritis, left knee: M17.12

## 2013-08-30 SURGERY — ARTHROPLASTY, KNEE, TOTAL
Anesthesia: General | Site: Knee | Laterality: Left | Wound class: Clean

## 2013-08-30 MED ORDER — DIPHENHYDRAMINE HCL 12.5 MG/5ML PO ELIX: 12.5000 mg | ORAL_SOLUTION | ORAL | Status: DC | PRN

## 2013-08-30 MED ORDER — TRANEXAMIC ACID 100 MG/ML IV SOLN
1000.0000 mg | INTRAVENOUS | Status: DC | PRN
  Administered 2013-08-30: 1000 mg via INTRAVENOUS

## 2013-08-30 MED ORDER — GLYCOPYRROLATE 0.2 MG/ML IJ SOLN
INTRAMUSCULAR | Status: DC | PRN
  Administered 2013-08-30 (×2): 0.3 mg via INTRAVENOUS

## 2013-08-30 MED ORDER — OCUVITE PO TABS
1.0000 | ORAL_TABLET | Freq: Every day | ORAL | Status: DC
  Administered 2013-08-31 – 2013-09-01 (×2): 1 via ORAL
  Filled 2013-08-30 (×2): qty 1

## 2013-08-30 MED ORDER — DEXAMETHASONE SODIUM PHOSPHATE 4 MG/ML IJ SOLN
INTRAMUSCULAR | Status: DC | PRN
  Administered 2013-08-30: 8 mg via INTRAVENOUS

## 2013-08-30 MED ORDER — METOCLOPRAMIDE HCL 10 MG PO TABS: 5.0000 mg | ORAL_TABLET | Freq: Three times a day (TID) | ORAL | Status: DC | PRN

## 2013-08-30 MED ORDER — FLUTICASONE PROPIONATE 50 MCG/ACT NA SUSP: 2.0000 | Freq: Every day | NASAL | Status: DC | PRN

## 2013-08-30 MED ORDER — LORATADINE 10 MG PO TABS
10.0000 mg | ORAL_TABLET | Freq: Every day | ORAL | Status: DC
  Administered 2013-08-30 – 2013-09-01 (×3): 10 mg via ORAL
  Filled 2013-08-30 (×3): qty 1

## 2013-08-30 MED ORDER — ONDANSETRON HCL 4 MG/2ML IJ SOLN: 4.0000 mg | Freq: Once | INTRAMUSCULAR | Status: DC | PRN

## 2013-08-30 MED ORDER — LACTATED RINGERS IV SOLN: INTRAVENOUS | Status: DC

## 2013-08-30 MED ORDER — ROCURONIUM BROMIDE 100 MG/10ML IV SOLN
INTRAVENOUS | Status: DC | PRN
  Administered 2013-08-30: 40 mg via INTRAVENOUS

## 2013-08-30 MED ORDER — METOCLOPRAMIDE HCL 5 MG/ML IJ SOLN: 5.0000 mg | Freq: Three times a day (TID) | INTRAMUSCULAR | Status: DC | PRN

## 2013-08-30 MED ORDER — EPHEDRINE SULFATE 50 MG/ML IJ SOLN
INTRAMUSCULAR | Status: DC | PRN
  Administered 2013-08-30 (×2): 5 mg via INTRAVENOUS

## 2013-08-30 MED ORDER — FENTANYL CITRATE 0.05 MG/ML IJ SOLN
INTRAMUSCULAR | Status: DC | PRN
  Administered 2013-08-30 (×2): 100 ug via INTRAVENOUS
  Administered 2013-08-30: 50 ug via INTRAVENOUS

## 2013-08-30 MED ORDER — TESTOSTERONE CYPIONATE 100 MG/ML IM SOLN: 100.0000 mg | INTRAMUSCULAR | Status: DC

## 2013-08-30 MED ORDER — BISACODYL 5 MG PO TBEC
5.0000 mg | DELAYED_RELEASE_TABLET | Freq: Every day | ORAL | Status: DC | PRN
  Administered 2013-08-30: 5 mg via ORAL
  Filled 2013-08-30 (×2): qty 1

## 2013-08-30 MED ORDER — DIPHENHYDRAMINE HCL 50 MG/ML IJ SOLN: 12.5000 mg | Freq: Four times a day (QID) | INTRAMUSCULAR | Status: DC | PRN

## 2013-08-30 MED ORDER — TESTOSTERONE CYPIONATE 200 MG/ML IM SOLN
100.0000 mg | INTRAMUSCULAR | Status: DC
  Filled 2013-08-30: qty 0.5

## 2013-08-30 MED ORDER — ONDANSETRON HCL 4 MG/2ML IJ SOLN
INTRAMUSCULAR | Status: DC | PRN
  Administered 2013-08-30: 4 mg via INTRAMUSCULAR

## 2013-08-30 MED ORDER — ARTIFICIAL TEARS OP OINT
TOPICAL_OINTMENT | OPHTHALMIC | Status: DC | PRN
  Administered 2013-08-30: 1 via OPHTHALMIC

## 2013-08-30 MED ORDER — PROPOFOL 10 MG/ML IV BOLUS
INTRAVENOUS | Status: DC | PRN
  Administered 2013-08-30: 200 mg via INTRAVENOUS

## 2013-08-30 MED ORDER — PHENOL 1.4 % MT LIQD: 1.0000 | OROMUCOSAL | Status: DC | PRN

## 2013-08-30 MED ORDER — ACETAMINOPHEN 650 MG RE SUPP: 650.0000 mg | Freq: Four times a day (QID) | RECTAL | Status: DC | PRN

## 2013-08-30 MED ORDER — ACETAMINOPHEN 325 MG PO TABS: 650.0000 mg | ORAL_TABLET | Freq: Four times a day (QID) | ORAL | Status: DC | PRN

## 2013-08-30 MED ORDER — LACTATED RINGERS IV SOLN
INTRAVENOUS | Status: DC | PRN
  Administered 2013-08-30 (×2): via INTRAVENOUS

## 2013-08-30 MED ORDER — CEFAZOLIN SODIUM-DEXTROSE 2-3 GM-% IV SOLR
2.0000 g | Freq: Four times a day (QID) | INTRAVENOUS | Status: AC
  Administered 2013-08-30 (×2): 2 g via INTRAVENOUS
  Filled 2013-08-30 (×2): qty 50

## 2013-08-30 MED ORDER — METHOCARBAMOL 100 MG/ML IJ SOLN
500.0000 mg | Freq: Four times a day (QID) | INTRAVENOUS | Status: DC | PRN
  Filled 2013-08-30: qty 5

## 2013-08-30 MED ORDER — PHENYLEPHRINE HCL 10 MG/ML IJ SOLN
INTRAMUSCULAR | Status: DC | PRN
  Administered 2013-08-30: 40 ug via INTRAVENOUS
  Administered 2013-08-30: 80 ug via INTRAVENOUS
  Administered 2013-08-30: 40 ug via INTRAVENOUS
  Administered 2013-08-30 (×3): 80 ug via INTRAVENOUS

## 2013-08-30 MED ORDER — LIDOCAINE HCL 4 % MT SOLN
OROMUCOSAL | Status: DC | PRN
  Administered 2013-08-30: 4 mL via TOPICAL

## 2013-08-30 MED ORDER — MAGNESIUM CITRATE PO SOLN
1.0000 | Freq: Once | ORAL | Status: AC | PRN
  Filled 2013-08-30: qty 296

## 2013-08-30 MED ORDER — ONDANSETRON HCL 4 MG PO TABS: 4.0000 mg | ORAL_TABLET | Freq: Four times a day (QID) | ORAL | Status: DC | PRN

## 2013-08-30 MED ORDER — HYDROCHLOROTHIAZIDE 25 MG PO TABS
25.0000 mg | ORAL_TABLET | Freq: Every day | ORAL | Status: DC
  Administered 2013-08-30 – 2013-09-01 (×3): 25 mg via ORAL
  Filled 2013-08-30 (×3): qty 1

## 2013-08-30 MED ORDER — LIDOCAINE HCL (CARDIAC) 20 MG/ML IV SOLN
INTRAVENOUS | Status: DC | PRN
  Administered 2013-08-30: 50 mg via INTRAVENOUS

## 2013-08-30 MED ORDER — HYDROMORPHONE HCL PF 1 MG/ML IJ SOLN: 0.5000 mg | INTRAMUSCULAR | Status: DC | PRN

## 2013-08-30 MED ORDER — ALBUTEROL SULFATE HFA 108 (90 BASE) MCG/ACT IN AERS: 2.0000 | INHALATION_SPRAY | RESPIRATORY_TRACT | Status: DC | PRN

## 2013-08-30 MED ORDER — MENTHOL 3 MG MT LOZG: 1.0000 | LOZENGE | OROMUCOSAL | Status: DC | PRN

## 2013-08-30 MED ORDER — ONDANSETRON HCL 4 MG/2ML IJ SOLN: 4.0000 mg | Freq: Four times a day (QID) | INTRAMUSCULAR | Status: DC | PRN

## 2013-08-30 MED ORDER — HYDROCODONE-ACETAMINOPHEN 7.5-325 MG PO TABS
1.0000 | ORAL_TABLET | ORAL | Status: DC | PRN
  Administered 2013-08-30: 2 via ORAL
  Administered 2013-08-30 (×3): 1 via ORAL
  Administered 2013-08-31 – 2013-09-01 (×6): 2 via ORAL
  Filled 2013-08-30: qty 2
  Filled 2013-08-30: qty 1
  Filled 2013-08-30 (×5): qty 2
  Filled 2013-08-30 (×2): qty 1
  Filled 2013-08-30 (×2): qty 2

## 2013-08-30 MED ORDER — TRANEXAMIC ACID 100 MG/ML IV SOLN
1000.0000 mg | INTRAVENOUS | Status: DC
  Filled 2013-08-30: qty 10

## 2013-08-30 MED ORDER — VITAMIN D 50 MCG (2000 UT) PO TABS: 2000.0000 [IU] | ORAL_TABLET | Freq: Every day | ORAL | Status: DC

## 2013-08-30 MED ORDER — VITAMIN D3 25 MCG (1000 UNIT) PO TABS
2000.0000 [IU] | ORAL_TABLET | Freq: Every day | ORAL | Status: DC
  Administered 2013-08-30 – 2013-09-01 (×3): 2000 [IU] via ORAL
  Filled 2013-08-30 (×3): qty 2

## 2013-08-30 MED ORDER — NEOSTIGMINE METHYLSULFATE 1 MG/ML IJ SOLN
INTRAMUSCULAR | Status: DC | PRN
  Administered 2013-08-30 (×2): 2 mg via INTRAVENOUS

## 2013-08-30 MED ORDER — ASPIRIN EC 325 MG PO TBEC
325.0000 mg | DELAYED_RELEASE_TABLET | Freq: Two times a day (BID) | ORAL | Status: DC
  Administered 2013-08-31 – 2013-09-01 (×3): 325 mg via ORAL
  Filled 2013-08-30 (×5): qty 1

## 2013-08-30 MED ORDER — HYDROMORPHONE HCL PF 1 MG/ML IJ SOLN: 0.2500 mg | INTRAMUSCULAR | Status: DC | PRN

## 2013-08-30 MED ORDER — CHLORHEXIDINE GLUCONATE 4 % EX LIQD: 60.0000 mL | Freq: Once | CUTANEOUS | Status: DC

## 2013-08-30 MED ORDER — DOCUSATE SODIUM 100 MG PO CAPS
100.0000 mg | ORAL_CAPSULE | Freq: Two times a day (BID) | ORAL | Status: DC
  Administered 2013-08-30 – 2013-09-01 (×5): 100 mg via ORAL
  Filled 2013-08-30 (×5): qty 1

## 2013-08-30 MED ORDER — ATORVASTATIN CALCIUM 40 MG PO TABS
40.0000 mg | ORAL_TABLET | Freq: Every day | ORAL | Status: DC
Start: 2013-08-30 — End: 2013-09-01
  Administered 2013-08-30 – 2013-09-01 (×3): 40 mg via ORAL
  Filled 2013-08-30 (×3): qty 1

## 2013-08-30 MED ORDER — LACTATED RINGERS IV SOLN
INTRAVENOUS | Status: DC
  Administered 2013-08-30: 20:00:00 via INTRAVENOUS

## 2013-08-30 MED ORDER — SODIUM CHLORIDE 0.9 % IR SOLN
Status: DC | PRN
  Administered 2013-08-30: 1000 mL
  Administered 2013-08-30: 3000 mL

## 2013-08-30 MED ORDER — METHOCARBAMOL 500 MG PO TABS
500.0000 mg | ORAL_TABLET | Freq: Four times a day (QID) | ORAL | Status: DC | PRN
  Administered 2013-08-31: 500 mg via ORAL
  Filled 2013-08-30: qty 1

## 2013-08-30 SURGICAL SUPPLY — 66 items
BANDAGE ELASTIC 4 VELCRO ST LF (GAUZE/BANDAGES/DRESSINGS) ×2 IMPLANT
BANDAGE ELASTIC 6 VELCRO ST LF (GAUZE/BANDAGES/DRESSINGS) ×2 IMPLANT
BANDAGE ESMARK 6X9 LF (GAUZE/BANDAGES/DRESSINGS) ×1 IMPLANT
BANDAGE GAUZE ELAST BULKY 4 IN (GAUZE/BANDAGES/DRESSINGS) ×4 IMPLANT
BENZOIN TINCTURE PRP APPL 2/3 (GAUZE/BANDAGES/DRESSINGS) ×2 IMPLANT
BLADE SAGITTAL 25.0X1.19X90 (BLADE) ×2 IMPLANT
BLADE SURG ROTATE 9660 (MISCELLANEOUS) IMPLANT
BNDG ELASTIC 6X10 VLCR STRL LF (GAUZE/BANDAGES/DRESSINGS) ×2 IMPLANT
BNDG ESMARK 6X9 LF (GAUZE/BANDAGES/DRESSINGS) ×2
BOWL SMART MIX CTS (DISPOSABLE) ×2 IMPLANT
CAPT RP KNEE ×2 IMPLANT
CEMENT HV SMART SET (Cement) ×4 IMPLANT
CLOTH BEACON ORANGE TIMEOUT ST (SAFETY) ×2 IMPLANT
CLSR STERI-STRIP ANTIMIC 1/2X4 (GAUZE/BANDAGES/DRESSINGS) ×2 IMPLANT
COVER SURGICAL LIGHT HANDLE (MISCELLANEOUS) ×2 IMPLANT
CUFF TOURNIQUET SINGLE 34IN LL (TOURNIQUET CUFF) ×2 IMPLANT
CUFF TOURNIQUET SINGLE 44IN (TOURNIQUET CUFF) IMPLANT
DRAPE EXTREMITY T 121X128X90 (DRAPE) ×2 IMPLANT
DRAPE PROXIMA HALF (DRAPES) ×2 IMPLANT
DRAPE U-SHAPE 47X51 STRL (DRAPES) ×2 IMPLANT
DRSG ADAPTIC 3X8 NADH LF (GAUZE/BANDAGES/DRESSINGS) ×2 IMPLANT
DRSG PAD ABDOMINAL 8X10 ST (GAUZE/BANDAGES/DRESSINGS) ×2 IMPLANT
DURAPREP 26ML APPLICATOR (WOUND CARE) ×2 IMPLANT
ELECT REM PT RETURN 9FT ADLT (ELECTROSURGICAL) ×2
ELECTRODE REM PT RTRN 9FT ADLT (ELECTROSURGICAL) ×1 IMPLANT
FACESHIELD LNG OPTICON STERILE (SAFETY) ×4 IMPLANT
GLOVE BIO SURGEON STRL SZ8.5 (GLOVE) ×2 IMPLANT
GLOVE BIOGEL PI IND STRL 8 (GLOVE) ×1 IMPLANT
GLOVE BIOGEL PI IND STRL 8.5 (GLOVE) ×1 IMPLANT
GLOVE BIOGEL PI INDICATOR 8 (GLOVE) ×1
GLOVE BIOGEL PI INDICATOR 8.5 (GLOVE) ×1
GLOVE SS BIOGEL STRL SZ 8 (GLOVE) ×1 IMPLANT
GLOVE SUPERSENSE BIOGEL SZ 8 (GLOVE) ×1
GOWN PREVENTION PLUS XLARGE (GOWN DISPOSABLE) ×2 IMPLANT
GOWN PREVENTION PLUS XXLARGE (GOWN DISPOSABLE) ×2 IMPLANT
GOWN STRL NON-REIN LRG LVL3 (GOWN DISPOSABLE) ×2 IMPLANT
HANDPIECE INTERPULSE COAX TIP (DISPOSABLE) ×1
HOOD PEEL AWAY FACE SHEILD DIS (HOOD) ×2 IMPLANT
IMMOBILIZER KNEE 20 (SOFTGOODS)
IMMOBILIZER KNEE 20 THIGH 36 (SOFTGOODS) IMPLANT
IMMOBILIZER KNEE 22 UNIV (SOFTGOODS) ×2 IMPLANT
IMMOBILIZER KNEE 24 THIGH 36 (MISCELLANEOUS) IMPLANT
IMMOBILIZER KNEE 24 UNIV (MISCELLANEOUS)
KIT BASIN OR (CUSTOM PROCEDURE TRAY) ×2 IMPLANT
KIT ROOM TURNOVER OR (KITS) ×2 IMPLANT
MANIFOLD NEPTUNE II (INSTRUMENTS) ×2 IMPLANT
NEEDLE HYPO 21X1 ECLIPSE (NEEDLE) ×2 IMPLANT
NS IRRIG 1000ML POUR BTL (IV SOLUTION) ×2 IMPLANT
PACK TOTAL JOINT (CUSTOM PROCEDURE TRAY) ×2 IMPLANT
PAD ARMBOARD 7.5X6 YLW CONV (MISCELLANEOUS) ×4 IMPLANT
QUILL 2 PDO ×2 IMPLANT
SET HNDPC FAN SPRY TIP SCT (DISPOSABLE) ×1 IMPLANT
SPONGE GAUZE 4X4 12PLY (GAUZE/BANDAGES/DRESSINGS) ×2 IMPLANT
STAPLER VISISTAT 35W (STAPLE) ×2 IMPLANT
SUCTION FRAZIER TIP 10 FR DISP (SUCTIONS) ×2 IMPLANT
SUT MNCRL AB 3-0 PS2 18 (SUTURE) ×2 IMPLANT
SUT VIC AB 0 CT1 27 (SUTURE) ×2
SUT VIC AB 0 CT1 27XBRD ANBCTR (SUTURE) ×2 IMPLANT
SUT VIC AB 2-0 CT1 27 (SUTURE) ×2
SUT VIC AB 2-0 CT1 TAPERPNT 27 (SUTURE) ×2 IMPLANT
SUT VLOC 180 0 24IN GS25 (SUTURE) ×2 IMPLANT
SYR 50ML LL SCALE MARK (SYRINGE) ×2 IMPLANT
TOWEL OR 17X24 6PK STRL BLUE (TOWEL DISPOSABLE) ×2 IMPLANT
TOWEL OR 17X26 10 PK STRL BLUE (TOWEL DISPOSABLE) ×2 IMPLANT
TRAY FOLEY CATH 14FR (SET/KITS/TRAYS/PACK) ×2 IMPLANT
WATER STERILE IRR 1000ML POUR (IV SOLUTION) ×2 IMPLANT

## 2013-08-30 NOTE — Anesthesia Postprocedure Evaluation (Signed)
  Anesthesia Post-op Note  Patient: Murel Shenberger Monjaras  Procedure(s) Performed: Procedure(s): TOTAL KNEE ARTHROPLASTY (Left)  Patient Location: PACU  Anesthesia Type:GA combined with regional for post-op pain  Level of Consciousness: awake, alert , oriented and patient cooperative  Airway and Oxygen Therapy: Patient Spontanous Breathing  Post-op Pain: mild  Post-op Assessment: Post-op Vital signs reviewed, Patient's Cardiovascular Status Stable, Respiratory Function Stable, Patent Airway, No signs of Nausea or vomiting and Pain level controlled  Post-op Vital Signs: stable  Complications: No apparent anesthesia complications

## 2013-08-30 NOTE — Op Note (Signed)
PREOP DIAGNOSIS: DJD LEFT KNEE POSTOP DIAGNOSIS:  same PROCEDURE: LEFT TKR ANESTHESIA: General and block ATTENDING SURGEON: Elleana Stillson G ASSISTANT: Lindwood Qua PA  INDICATIONS FOR PROCEDURE: Shane Cook is a 72 y.o. male who has struggled for a long time with pain due to degenerative arthritis of the left knee.  The patient has failed many conservative non-operative measures and at this point has pain which limits the ability to sleep and walk.  The patient is offered total knee replacement.  Informed operative consent was obtained after discussion of possible risks of anesthesia, infection, neurovascular injury, DVT, and death.  The importance of the post-operative rehabilitation protocol to optimize result was stressed extensively with the patient.  SUMMARY OF FINDINGS AND PROCEDURE:  Shane Cook was taken to the operative suite where under the above anesthesia a left knee replacement was performed.  There were advanced degenerative changes and the bone quality was excellent.  We used the DePuy system and placed size large femur, 5 tibia, 41 mm all polyethylene patella, and a size 10 mm spacer.  The patient was admitted for appropriate post-op care to include perioperative antibiotics and mechanical and pharmacologic measures for DVT prophylaxis.  DESCRIPTION OF PROCEDURE:  Shane Cook was taken to the operative suite where the above anesthesia was applied.  The patient was positioned supine and prepped and draped in normal sterile fashion.  An appropriate time out was performed.  After the administration of Kefzol pre-op antibiotic the leg was elevated and exsanguinated and a tourniquet inflated.  A standard longitudinal incision was made on the anterior knee.  Dissection was carried down to the extensor mechanism.  All appropriate anti-infective measures were used including the pre-operative antibiotic, betadine impregnated drape, and closed hooded exhaust systems for each  member of the surgical team.  A medial parapatellar incision was made in the extensor mechanism and the knee cap flipped and the knee flexed.  Some residual meniscal tissues were removed along with any remaining ACL/PCL tissue.  A guide was placed on the tibia and a flat cut was made on it's superior surface.  An intramedullary guide was placed in the femur and was utilized to make anterior and posterior cuts creating an appropriate flexion gap.  A second intramedullary guide was placed in the femur to make a distal cut properly balancing the knee with an extension gap equal to the flexion gap.  The three bones sized to the above mentioned sizes and the appropriate guides were placed and utilized.  A trial reduction was done and the knee easily came to full extension and the patella tracked well on flexion.  The trial components were removed and all bones were cleaned with pulsatile lavage and then dried thoroughly.  Cement was mixed and was pressurized onto the bones followed by placement of the aforementioned components.  Excess cement was trimmed and pressure was held on the components until the cement had hardened.  The tourniquet was deflated and a small amount of bleeding was controlled with cautery and pressure.  The knee was irrigated thoroughly.  The extensor mechanism was re-approximated with Quill suture in running fashion.  The knee was flexed and the repair was solid.  The subcutaneous tissues were re-approximated with #0 and #2-0 vicryl and the skin closed with a subcuticular stitch and steristrips.  A sterile dressing was applied.  Intraoperative fluids, EBL, and tourniquet time can be obtained from anesthesia records.  DISPOSITION:  The patient was taken to recovery room in stable  condition and admitted for appropriate post-op care to include peri-operative antibiotic and DVT prophylaxis with mechanical and pharmacologic measures.  Lavender Stanke G 08/30/2013, 9:07 AM

## 2013-08-30 NOTE — Transfer of Care (Signed)
Immediate Anesthesia Transfer of Care Note  Patient: Shane Cook  Procedure(s) Performed: Procedure(s): TOTAL KNEE ARTHROPLASTY (Left)  Patient Location: PACU  Anesthesia Type:General and Regional  Level of Consciousness: awake, oriented, patient cooperative and responds to stimulation  Airway & Oxygen Therapy: Patient Spontanous Breathing and Patient connected to nasal cannula oxygen  Post-op Assessment: Report given to PACU RN, Post -op Vital signs reviewed and stable and Patient moving all extremities X 4  Post vital signs: Reviewed and stable  Complications: No apparent anesthesia complications

## 2013-08-30 NOTE — Progress Notes (Signed)
UR COMPLETED  

## 2013-08-30 NOTE — Anesthesia Preprocedure Evaluation (Addendum)
Anesthesia Evaluation  Patient identified by MRN, date of birth, ID band Patient awake    Reviewed: Allergy & Precautions, H&P , NPO status , Patient's Chart, lab work & pertinent test results  Airway Mallampati: II TM Distance: >3 FB Neck ROM: Full    Dental  (+) Upper Dentures, Edentulous Upper and Dental Advisory Given   Pulmonary asthma , former smoker,  breath sounds clear to auscultation        Cardiovascular hypertension, Pt. on medications Rhythm:Regular Rate:Normal     Neuro/Psych negative neurological ROS     GI/Hepatic negative GI ROS, Neg liver ROS,   Endo/Other  negative endocrine ROS  Renal/GU Renal InsufficiencyRenal disease     Musculoskeletal  (+) Arthritis -,   Abdominal Normal abdominal exam  (+)   Peds  Hematology negative hematology ROS (+)   Anesthesia Other Findings   Reproductive/Obstetrics                          Anesthesia Physical Anesthesia Plan  ASA: II  Anesthesia Plan: General and Regional   Post-op Pain Management:    Induction: Intravenous  Airway Management Planned: Oral ETT  Additional Equipment:   Intra-op Plan:   Post-operative Plan: Extubation in OR  Informed Consent: I have reviewed the patients History and Physical, chart, labs and discussed the procedure including the risks, benefits and alternatives for the proposed anesthesia with the patient or authorized representative who has indicated his/her understanding and acceptance.     Plan Discussed with: CRNA, Anesthesiologist and Surgeon  Anesthesia Plan Comments: (Discussed FNB/GA vs SAB/MAC with patient and family.)       Anesthesia Quick Evaluation

## 2013-08-30 NOTE — Evaluation (Signed)
Physical Therapy Evaluation Patient Details Name: Shane Cook MRN: [id number] DOB: 12/29/1940 Today's Date: 08/30/2013 Time: 9528-4132 PT Time Calculation (min): 32 min  PT Assessment / Plan / Recommendation History of Present Illness  s/p elective LTKA  Clinical Impression  Patient is s/p  Above surgery resulting in functional limitations due to the deficits listed below (see PT Problem List).  Patient will benefit from skilled PT to increase their independence and safety with mobility to allow discharge to the venue listed below.       PT Assessment  Patient needs continued PT services    Follow Up Recommendations  Home health PT;Supervision/Assistance - 24 hour    Does the patient have the potential to tolerate intense rehabilitation      Barriers to Discharge        Equipment Recommendations  Rolling walker with 5" wheels;3in1 (PT)    Recommendations for Other Services OT consult   Frequency 7X/week    Precautions / Restrictions Precautions Precautions: Knee Precaution Comments: Pt educated in no pillow under knee Required Braces or Orthoses:  (order was to dc KI) Restrictions Weight Bearing Restrictions: Yes LLE Weight Bearing: Weight bearing as tolerated   Pertinent Vitals/Pain Reports very little pain LLE      Mobility  Bed Mobility Bed Mobility: Supine to Sit;Sitting - Scoot to Edge of Bed Supine to Sit: 4: Min guard Sitting - Scoot to [place] of Bed: 4: Min guard Details for Bed Mobility Assistance: Cues for technique; Overall quite smooth motion Transfers Transfers: Sit to Stand;Stand to Sit Sit to Stand: 4: Min assist;From bed;With upper extremity assist Stand to Sit: 3: Mod assist;To chair/3-in-1;With upper extremity assist;With armrests Details for Transfer Assistance: Cues for technique, safety, and hand placement; good rise; needed phsyical assist to control descent Ambulation/Gait Ambulation/Gait Assistance: 1: +2 Total assist Ambulation/Gait:  Patient Percentage: 60% Ambulation Distance (Feet): 8 Feet Assistive device: Rolling walker Ambulation/Gait Assistance Details: cues for gait sequence and to activat L quad for stance stability; Required L knee blocking secondary to buckling; amb distance limited by dizziness, which subsided once pt sat Gait Pattern: Step-to pattern Gait velocity: quite slow    Exercises Total Joint Exercises Quad Sets: AROM;Left;5 reps Heel Slides: AAROM;Left;5 reps Straight Leg Raises: AAROM;Left;5 reps   PT Diagnosis: Difficulty walking;Acute pain  PT Problem List: Decreased strength;Decreased range of motion;Decreased activity tolerance;Decreased balance;Decreased mobility;Decreased knowledge of use of DME;Pain PT Treatment Interventions: DME instruction;Gait training;Stair training;Functional mobility training;Therapeutic activities;Therapeutic exercise;Patient/family education     PT Goals(Current goals can be found in the care plan section) Acute Rehab PT Goals Patient Stated Goal: get back to doing whatever he wants PT Goal Formulation: With patient Time For Goal Achievement: 09/06/13 Potential to Achieve Goals: Good  Visit Information  Last PT Received On: 08/30/13 Assistance Needed: +1 History of Present Illness: s/p elective LTKA       Prior Functioning  Home Living Family/patient expects to be discharged to:: Private residence Living Arrangements: Spouse/significant other Available Help at Discharge: Family;Available 24 hours/day Type of Home: House Home Access: Stairs to enter [employer] of Steps: 4 Entrance Stairs-Rails: Right;Left;Can reach both Home Layout: One level Home Equipment: Grab bars - tub/shower (other equipment tbd) Prior Function Level of Independence: Independent Communication Communication: No difficulties    Cognition  Cognition Arousal/Alertness: Awake/alert Behavior During Therapy: WFL for tasks assessed/performed Overall Cognitive  Status: Within Functional Limits for tasks assessed    Extremity/Trunk Assessment Upper Extremity Assessment Upper Extremity Assessment: Overall WFL for tasks  assessed Lower Extremity Assessment Lower Extremity Assessment: LLE deficits/detail LLE Deficits / Details: Decr knee extension control post nerve block, but able to activate quad   Balance    End of Session PT - End of Session Equipment Utilized During Treatment: Gait belt Activity Tolerance: Patient tolerated treatment well Patient left: in chair;with call bell/phone within reach;with family/visitor present Nurse Communication: Mobility status  GP     Olen Pel [place], [place] [phone]  08/30/2013, 4:19 PM

## 2013-08-30 NOTE — Anesthesia Procedure Notes (Signed)
Anesthesia Regional Block:  Femoral nerve block  Pre-Anesthetic Checklist: ,, timeout performed, Correct Patient, Correct Site, Correct Laterality, Correct Procedure, Correct Position, site marked, Risks and benefits discussed,  Surgical consent,  Pre-op evaluation,  At surgeon's request and post-op pain management  Laterality: Left  Prep: chloraprep and alcohol swabs       Needles:  Injection technique: Single-shot  Needle Type: Stimulator Needle - 80        Needle insertion depth: 5 cm   Additional Needles:  Procedures: nerve stimulator Femoral nerve block  Nerve Stimulator or Paresthesia:  Response: 0.5 mA, 0.1 ms, 4 cm  Additional Responses:   Narrative:  Start time: 08/30/2013 7:15 AM End time: 08/30/2013 7:20 AM Injection made incrementally with aspirations every 5 mL.  Performed by: Personally  Anesthesiologist: Maren Beach MD  Additional Notes: Pt accepts procedure and risks. 15cc 0.5% Marcaine w/ epi w/o discomfort or difficulty. GES

## 2013-08-30 NOTE — Interval H&P Note (Signed)
History and Physical Interval Note:  08/30/2013 7:27 AM  Shane Cook  has presented today for surgery, with the diagnosis of DEGENERATIVE JOINT DISEASE LEFT KNEE  The various methods of treatment have been discussed with the patient and family. After consideration of risks, benefits and other options for treatment, the patient has consented to  Procedure(s): TOTAL KNEE ARTHROPLASTY (Left) as a surgical intervention .  The patient's history has been reviewed, patient examined, no change in status, stable for surgery.  I have reviewed the patient's chart and labs.  Questions were answered to the patient's satisfaction.     Anjela Cassara G

## 2013-08-30 NOTE — Progress Notes (Signed)
Orthopedic Tech Progress Note Patient Details:  Shane Cook Apr 28, 1941 [id number] CPM applied to Left LE with appropriate settings. OHF applied to bed.  CPM Left Knee CPM Left Knee: On Left Knee Flexion (Degrees): 60 Left Knee Extension (Degrees): 0   Asia R Thompson 08/30/2013, 10:44 AM

## 2013-08-30 NOTE — Preoperative (Signed)
Beta Blockers   Reason not to administer Beta Blockers:Not Applicable 

## 2013-08-31 LAB — BASIC METABOLIC PANEL
BUN: 16 mg/dL (ref 6–23)
CO2: 31 mEq/L (ref 19–32)
Calcium: 8.4 mg/dL (ref 8.4–10.5)
Creatinine, Ser: 1.32 mg/dL (ref 0.50–1.35)
GFR calc non Af Amer: 52 mL/min — ABNORMAL LOW (ref >90)
Glucose, Bld: 117 mg/dL — ABNORMAL HIGH (ref 70–99)

## 2013-08-31 LAB — CBC
HCT: 43.2 % (ref 39.0–52.0)
Hemoglobin: 14.5 g/dL (ref 13.0–17.0)
MCH: 30.4 pg (ref 26.0–34.0)
MCHC: 33.6 g/dL (ref 30.0–36.0)
MCV: 90.6 fL (ref 78.0–100.0)
RBC: 4.77 MIL/uL (ref 4.22–5.81)

## 2013-08-31 NOTE — Evaluation (Signed)
Occupational Therapy Evaluation Patient Details Name: Shane Cook MRN: [id number] DOB: 05-Apr-1941 Today's Date: 08/31/2013 Time: 4098-1191 OT Time Calculation (min): 30 min  OT Assessment / Plan / Recommendation History of present illness s/p elective LTKA   Clinical Impression   Pt admitted with above. Education completed. Pt has necessary level of assist at him. No further acute OT needed. Signing off.    OT Assessment  Patient does not need any further OT services    Follow Up Recommendations  No OT follow up;Supervision/Assistance - 24 hour    Barriers to Discharge      Equipment Recommendations  3 in 1 bedside comode    Recommendations for Other Services    Frequency       Precautions / Restrictions Precautions Precautions: Knee Precaution Comments: Pt educated in no pillow under knee Required Braces or Orthoses:  (order was to dc KI) Restrictions Weight Bearing Restrictions: Yes LLE Weight Bearing: Weight bearing as tolerated   Pertinent Vitals/Pain See vitals    ADL  Upper Body Bathing: Simulated;Set up Where Assessed - Upper Body Bathing: Unsupported sitting Lower Body Bathing: Simulated;Minimal assistance Where Assessed - Lower Body Bathing: Unsupported sit to stand Upper Body Dressing: Performed;Set up Where Assessed - Upper Body Dressing: Unsupported sitting Lower Body Dressing: Performed;Minimal assistance Where Assessed - Lower Body Dressing: Unsupported sit to stand Toilet Transfer: Simulated;Supervision/safety Toilet Transfer Method: Sit to Barista:  (simulated at bed) Toileting - Architect and Hygiene: Performed;Supervision/safety Where Assessed - Engineer, mining and Hygiene: Standing Equipment Used: Gait belt;Rolling walker Transfers/Ambulation Related to ADLs: supervision ADL Comments: Pt just returned from PT session but agreeable to working with OT.  Pt ambulated to bathroom to stand at  toilet and void. Educated pt and wife on LB dressing techniques and how to reacher to assist with LB dressing.  Pt states his wife will assist him at home and wife agrees. Provided education on use of 3n1 as shower chair and pt verbalized understanding.    OT Diagnosis:    OT Problem List:   OT Treatment Interventions:     OT Goals(Current goals can be found in the care plan section) Acute Rehab OT Goals Patient Stated Goal: get back to doing whatever he wants  Visit Information  Last OT Received On: 08/31/13 Assistance Needed: +1 History of Present Illness: s/p elective LTKA       Prior Functioning     Home Living Family/patient expects to be discharged to:: Private residence Living Arrangements: Spouse/significant other Available Help at Discharge: Family;Available 24 hours/day Type of Home: House Home Access: Stairs to enter [employer] of Steps: 4 Entrance Stairs-Rails: Right;Left;Can reach both Home Layout: One level Home Equipment: Grab bars - tub/shower Prior Function Level of Independence: Independent         Vision/Perception     Cognition  Cognition Arousal/Alertness: Awake/alert Behavior During Therapy: WFL for tasks assessed/performed Overall Cognitive Status: Within Functional Limits for tasks assessed    Extremity/Trunk Assessment Upper Extremity Assessment Upper Extremity Assessment: Overall WFL for tasks assessed     Mobility Bed Mobility Bed Mobility: Supine to Sit;Sitting - Scoot to Edge of Bed;Sit to Supine Supine to Sit: 4: Min guard Sitting - Scoot to [place] of Bed: 4: Min guard Sit to Supine: 4: Min guard  Transfers Transfers: Sit to Stand;Stand to Sit Sit to Stand: 5: Supervision;From bed Stand to Sit: 5: Supervision;To bed Details for Transfer Assistance: Cues for technique, safety, and hand placement; good  rise     Exercise     Balance     End of Session OT - End of Session Equipment Utilized During Treatment:  Rolling walker Activity Tolerance: Patient tolerated treatment well Patient left: in bed;in CPM;with family/visitor present CPM Left Knee CPM Left Knee: On  GO    08/31/2013 Cipriano Mile OTR/L Pager [phone] Office [phone]  Cipriano Mile 08/31/2013, 5:53 PM

## 2013-08-31 NOTE — Progress Notes (Signed)
Subjective: 1 Day Post-Op Procedure(s) (LRB): TOTAL KNEE ARTHROPLASTY (Left)  Activity level:  Left leg weightbearing as tolerated Diet tolerance:  Eating Voiding:  Voiding Patient reports pain as 2 on 0-10 scale.    Objective: Vital signs in last 24 hours: Temp:  [96.9 F (36.1 C)-98.9 F (37.2 C)] 97.9 F (36.6 C) (10/08 0607) Pulse Rate:  [79-89] 87 (10/08 0607) Resp:  [10-18] 18 (10/08 0607) BP: (96-141)/(57-70) 96/62 mmHg (10/08 0607) SpO2:  [96 %-100 %] 96 % (10/08 0607)  Labs:  Recent Labs  08/31/13 0605  HGB 14.5    Recent Labs  08/31/13 0605  WBC 13.3*  RBC 4.77  HCT 43.2  PLT 194    Recent Labs  08/31/13 0605  NA 139  K 3.6  CL 101  CO2 31  BUN 16  CREATININE 1.32  GLUCOSE 117*  CALCIUM 8.4   No results found for this basename: LABPT, INR,  in the last 72 hours  Physical Exam:  Neurologically intact ABD soft Neurovascular intact Sensation intact distally Intact pulses distally Dorsiflexion/Plantar flexion intact No cellulitis present Compartment soft  Assessment/Plan:  1 Day Post-Op Procedure(s) (LRB): TOTAL KNEE ARTHROPLASTY (Left) Advance diet Up with therapy D/C IV fluids Plan for discharge tomorrow if he does okay and therapy. Continue ASA 325 twice a day x2 weeks and SCDs. Discharge will be to his home with home health care    Shane Cook R 08/31/2013, 8:11 AM

## 2013-08-31 NOTE — Progress Notes (Signed)
Physical Therapy Treatment Patient Details Name: Shane Cook MRN: [id number] DOB: [birth year] Today's Date: 08/31/2013 Time: 1341-1416 PT Time Calculation (min): 35 min  PT Assessment / Plan / Recommendation  History of Present Illness s/p elective LTKA   PT Comments   Continuing progress and improving L stance stability; Stair training complete (though pt may want to practice again) ; On track for dc tomorrow   Follow Up Recommendations  Home health PT;Supervision/Assistance - 24 hour     Does the patient have the potential to tolerate intense rehabilitation     Barriers to Discharge        Equipment Recommendations  Rolling walker with 5" wheels;3in1 (PT)    Recommendations for Other Services    Frequency 7X/week   Progress towards PT Goals Progress towards PT goals: Progressing toward goals  Plan Current plan remains appropriate    Precautions / Restrictions Precautions Precautions: Knee Precaution Comments: Pt educated in no pillow under knee Required Braces or Orthoses:  (order was to dc KI) Restrictions Weight Bearing Restrictions: Yes LLE Weight Bearing: Weight bearing as tolerated   Pertinent Vitals/Pain 6/10 L knee pain; patient repositioned for comfort in CPM    Mobility  Bed Mobility Bed Mobility: Supine to Sit;Sitting - Scoot to [place] of Bed Sitting - Scoot to Edge of Bed: 4: Min guard Details for Bed Mobility Assistance: Cues for technique; Overall quite smooth motion Transfers Transfers: Sit to Stand;Stand to Sit Sit to Stand: From bed;With upper extremity assist;5: Supervision Stand to Sit: To chair/3-in-1;With upper extremity assist;With armrests;5: Supervision Details for Transfer Assistance: Cues for technique, safety, and hand placement; good rise Ambulation/Gait Ambulation/Gait Assistance: 5: Supervision Ambulation Distance (Feet): 200 Feet Assistive device: Rolling walker Ambulation/Gait Assistance Details: Cues for L quad activation for  stance stability Gait Pattern: Step-through pattern General Gait Details: pt very much wants to perform well Stairs: Yes Stairs Assistance: 4: Min guard Stair Management Technique: Two rails;Step to pattern;Forwards Number of Stairs: 5 (cues for sequence)    Exercises     PT Diagnosis:    PT Problem List:   PT Treatment Interventions:     PT Goals (current goals can now be found in the care plan section) Acute Rehab PT Goals Patient Stated Goal: get back to doing whatever he wants PT Goal Formulation: With patient Time For Goal Achievement: 09/06/13 Potential to Achieve Goals: Good  Visit Information  Last PT Received On: 08/31/13 Assistance Needed: +1 History of Present Illness: s/p elective LTKA    Subjective Data  Subjective: hoping to go home tomorrow Patient Stated Goal: get back to doing whatever he wants   Cognition  Cognition Arousal/Alertness: Awake/alert Behavior During Therapy: WFL for tasks assessed/performed Overall Cognitive Status: Within Functional Limits for tasks assessed    Balance     End of Session PT - End of Session Equipment Utilized During Treatment: Gait belt Activity Tolerance: Patient tolerated treatment well Patient left: with call bell/phone within reach;with family/visitor present;in bed;in CPM Nurse Communication: Mobility status   Shane Cook     Shane Cook Shane Cook,  [phone]  08/31/2013, 4:07 PM

## 2013-08-31 NOTE — Progress Notes (Signed)
08/31/13 Set up with HHPT with Gordy Clement by MD office. Spoke with patient and his wife, no change in d/c plans. [employer] delivered rolling walker, CPM and 3N1 to patient's home. Jacquelynn Cree RN, BSN, CCM

## 2013-08-31 NOTE — Progress Notes (Signed)
Orthopedic Tech Progress Note Patient Details:  Shane Cook [birth year] [id number] On cpm at 8:15 pm LLE 0-70 Patient ID: Shane Cook, male   DOB: 1941/10/16, 72 y.o.   MRN: [id number]   Jennye Moccasin 08/31/2013, 8:13 PM

## 2013-08-31 NOTE — Progress Notes (Signed)
Physical Therapy Treatment Patient Details Name: Shane Cook MRN: [id number] DOB: 16-Jun-1941 Today's Date: 08/31/2013 Time: 4098-1191 PT Time Calculation (min): 44 min  PT Assessment / Plan / Recommendation  History of Present Illness s/p elective LTKA   PT Comments   Making steady progress with mobility; On track for dc tomorrow; Pt and family had many questions re: his progress, what to expect form rehab, CPM, etc -- had a lengthy discussion   Follow Up Recommendations  Home health PT;Supervision/Assistance - 24 hour     Does the patient have the potential to tolerate intense rehabilitation     Barriers to Discharge        Equipment Recommendations  Rolling walker with 5" wheels;3in1 (PT)    Recommendations for Other Services OT consult  Frequency 7X/week   Progress towards PT Goals Progress towards PT goals: Progressing toward goals  Plan Current plan remains appropriate    Precautions / Restrictions Precautions Precautions: Knee Precaution Comments: Pt educated in no pillow under knee Required Braces or Orthoses:  (order was to dc KI) Restrictions Weight Bearing Restrictions: Yes LLE Weight Bearing: Weight bearing as tolerated   Pertinent Vitals/Pain Reports minimal pain L knee; did not rate patient repositioned for comfort and for optimal extension    Mobility  Bed Mobility Bed Mobility: Supine to Sit;Sitting - Scoot to Edge of Bed Supine to Sit: 4: Min guard Sitting - Scoot to [place] of Bed: 4: Min guard Details for Bed Mobility Assistance: Cues for technique; Overall quite smooth motion Transfers Transfers: Sit to Stand;Stand to Sit Sit to Stand: From bed;With upper extremity assist;4: Min guard Stand to Sit: To chair/3-in-1;With upper extremity assist;With armrests;4: Min guard Details for Transfer Assistance: Cues for technique, safety, and hand placement; good rise Ambulation/Gait Ambulation/Gait Assistance: 4: Min guard Ambulation Distance (Feet): 25  Feet Assistive device: Rolling walker Ambulation/Gait Assistance Details: Verbal and tactile cues for gait sequence, and to activate L quad for stance stability Gait Pattern: Step-to pattern Gait velocity: quite slow General Gait Details: pt very much wants to perform well Stairs:  (possibly for this afternoon)    Exercises Total Joint Exercises Ankle Circles/Pumps: AROM;Both;10 reps Quad Sets: AROM;Left;10 reps Short Arc QuadBarbaraann Boys;Left;10 reps Heel Slides: AAROM;Left;10 reps Straight Leg Raises: AAROM;Left;10 reps Goniometric ROM: approx2-90 deg   PT Diagnosis:    PT Problem List:   PT Treatment Interventions:     PT Goals (current goals can now be found in the care plan section) Acute Rehab PT Goals Patient Stated Goal: get back to doing whatever he wants PT Goal Formulation: With patient Time For Goal Achievement: 09/06/13 Potential to Achieve Goals: Good  Visit Information  Last PT Received On: 08/31/13 Assistance Needed: +1 History of Present Illness: s/p elective LTKA    Subjective Data  Subjective: knee feels better Patient Stated Goal: get back to doing whatever he wants   Cognition  Cognition Arousal/Alertness: Awake/alert Behavior During Therapy: WFL for tasks assessed/performed Overall Cognitive Status: Within Functional Limits for tasks assessed    Balance     End of Session PT - End of Session Equipment Utilized During Treatment: Gait belt Activity Tolerance: Patient tolerated treatment well Patient left: in chair;with call bell/phone within reach;with family/visitor present Nurse Communication: Mobility status   GP     Olen Pel [place], [place] [phone]  08/31/2013, 11:18 AM

## 2013-09-01 LAB — CBC
MCH: 31.2 pg (ref 26.0–34.0)
MCHC: 34.3 g/dL (ref 30.0–36.0)
MCV: 90.8 fL (ref 78.0–100.0)
Platelets: 177 10*3/uL (ref 150–400)
RDW: 13.9 % (ref 11.5–15.5)

## 2013-09-01 MED ORDER — ASPIRIN 325 MG PO TBEC: 325.0000 mg | DELAYED_RELEASE_TABLET | Freq: Two times a day (BID) | ORAL | Status: DC

## 2013-09-01 MED ORDER — HYDROCODONE-ACETAMINOPHEN 7.5-325 MG PO TABS: 1.0000 | ORAL_TABLET | ORAL | Status: DC | PRN

## 2013-09-01 MED ORDER — METHOCARBAMOL 500 MG PO TABS: 500.0000 mg | ORAL_TABLET | Freq: Four times a day (QID) | ORAL | Status: DC | PRN

## 2013-09-01 NOTE — Progress Notes (Signed)
Patient d/c to home.  D/C instructions and prescriptions given to patient.  IV removed.    Patient set up with HHPT.

## 2013-09-01 NOTE — Discharge Summary (Signed)
Patient ID: Shane Cook MRN: [id number] DOB/AGE: 1941-09-15 72 y.o.  Admit date: 08/30/2013 Discharge date: 09/01/2013  Admission Diagnoses:  Principal Problem:   Left knee DJD   Discharge Diagnoses:  Same  Past Medical History  Diagnosis Date  . Hypercholesterolemia   . Low testosterone   . Edema     takes hydrochlorothiazide  . Hypertension   . Asthma     no problems recently  . Heart murmur     hx yrs ago  . Nephritis     72 yrs old  hospitalized. no problem since  . Arthritis     Surgeries: Procedure(s): TOTAL KNEE ARTHROPLASTY on 08/30/2013   Consultants:    Discharged Condition: Improved  Hospital Course: CALEY VOLKERT is an 72 y.o. male who was admitted 08/30/2013 for operative treatment ofLeft knee DJD. Patient has severe unremitting pain that affects sleep, daily activities, and work/hobbies. After pre-op clearance the patient was taken to the operating room on 08/30/2013 and underwent  Procedure(s): TOTAL KNEE ARTHROPLASTY.    Patient was given perioperative antibiotics: Anti-infectives   Start     Dose/Rate Route Frequency Ordered Stop   08/30/13 1400  ceFAZolin (ANCEF) IVPB 2 g/50 mL premix     2 g 100 mL/hr over 30 Minutes Intravenous Every 6 hours 08/30/13 1154 08/30/13 2014   08/30/13 0600  ceFAZolin (ANCEF) IVPB 2 g/50 mL premix     2 g 100 mL/hr over 30 Minutes Intravenous On call to O.R. 08/29/13 1247 08/30/13 4098       Patient was given sequential compression devices, early ambulation, and chemoprophylaxis to prevent DVT.  Patient benefited maximally from hospital stay and there were no complications.    Recent vital signs: Patient Vitals for the past 24 hrs:  BP Temp Pulse Resp SpO2  09/01/13 0525 125/65 mmHg 98.2 F (36.8 C) 105 16 95 %  08/31/13 2222 135/76 mmHg 99.2 F (37.3 C) 105 16 96 %  08/31/13 1342 122/71 mmHg 97.6 F (36.4 C) 93 17 98 %     Recent laboratory studies:  Recent Labs  08/31/13 0605 09/01/13 0720  WBC  13.3* 13.2*  HGB 14.5 14.9  HCT 43.2 43.4  PLT 194 177  NA 139  --   K 3.6  --   CL 101  --   CO2 31  --   BUN 16  --   CREATININE 1.32  --   GLUCOSE 117*  --   CALCIUM 8.4  --      Discharge Medications:     Medication List    STOP taking these medications       ferrous sulfate 325 (65 FE) MG EC tablet      TAKE these medications       acetaminophen 325 MG tablet  Commonly known as:  TYLENOL  Take 650 mg by mouth every 4 (four) hours as needed for pain.     albuterol 108 (90 BASE) MCG/ACT inhaler  Commonly known as:  PROVENTIL HFA;VENTOLIN HFA  Inhale 2 puffs into the lungs every 4 (four) hours as needed for shortness of breath.     aspirin 325 MG EC tablet  Take 1 tablet (325 mg total) by mouth 2 (two) times daily.     atorvastatin 40 MG tablet  Commonly known as:  LIPITOR  Take 40 mg by mouth daily.     beta carotene w/minerals tablet  Take 1 tablet by mouth daily.     fluticasone 50 MCG/ACT  nasal spray  Commonly known as:  FLONASE  Place 2 sprays into the nose daily as needed for rhinitis or allergies.     hydrochlorothiazide 25 MG tablet  Commonly known as:  HYDRODIURIL  Take 25 mg by mouth daily.     HYDROcodone-acetaminophen 7.5-325 MG per tablet  Commonly known as:  NORCO  Take 1-2 tablets by mouth every 4 (four) hours as needed.     KLS ALLER-TEC 10 MG tablet  Generic drug:  cetirizine  Take 10 mg by mouth daily.     methocarbamol 500 MG tablet  Commonly known as:  ROBAXIN  Take 1 tablet (500 mg total) by mouth every 6 (six) hours as needed.     PROSTATE HEALTH PO  Take 1 capsule by mouth daily.     sildenafil 100 MG tablet  Commonly known as:  VIAGRA  Take 100 mg by mouth daily as needed for erectile dysfunction.     testosterone cypionate 200 MG/ML injection  Commonly known as:  DEPOTESTOTERONE CYPIONATE  Inject 100 mg into the muscle once a week. Takes on Saturdays     Vitamin D 2000 UNITS tablet  Take 2,000 Units by mouth daily.         Diagnostic Studies: Dg Chest 2 View  08/23/2013   CLINICAL DATA:  Pre operative respiratory exam. Osteoarthritis of the knee.  EXAM: CHEST  2 VIEW  COMPARISON:  09/17/2007  FINDINGS: The heart size and mediastinal contours are within normal limits. Both lungs are clear. The visualized skeletal structures are unremarkable.  IMPRESSION: No active cardiopulmonary disease.   Electronically Signed   By: Geanie Cooley   On: 08/23/2013 11:30    Disposition:       Discharge Orders   Future Orders Complete By Expires   Call MD / Call 911  As directed    Comments:     If you experience chest pain or shortness of breath, CALL 911 and be transported to the hospital emergency room.  If you develope a fever above 101 F, pus (white drainage) or increased drainage or redness at the wound, or calf pain, call your surgeon's office.   Constipation Prevention  As directed    Comments:     Drink plenty of fluids.  Prune juice may be helpful.  You may use a stool softener, such as Colace (over the counter) 100 mg twice a day.  Use MiraLax (over the counter) for constipation as needed.   Diet - low sodium heart healthy  As directed    Increase activity slowly as tolerated  As directed       Follow-up Information   Follow up with DALLDORF,PETER G, MD. Call in 2 weeks.   Specialty:  Orthopedic Surgery   Contact information:   [address] [place] [address] [phone]       Please follow up. [facility] [phone])        Signed: Prince Rome 09/01/2013, 12:25 PM

## 2013-09-01 NOTE — Progress Notes (Signed)
Subjective: 2 Days Post-Op Procedure(s) (LRB): TOTAL KNEE ARTHROPLASTY (Left)  Activity level:  wbat Diet tolerance:  ok Voiding:  ok Patient reports pain as 2 on 0-10 scale.    Objective: Vital signs in last 24 hours: Temp:  [97.6 F (36.4 C)-99.2 F (37.3 C)] 98.2 F (36.8 C) (10/09 0525) Pulse Rate:  [93-105] 105 (10/09 0525) Resp:  [16-17] 16 (10/09 0525) BP: (122-135)/(65-76) 125/65 mmHg (10/09 0525) SpO2:  [95 %-98 %] 95 % (10/09 0525)  Labs:  Recent Labs  08/31/13 0605 09/01/13 0720  HGB 14.5 14.9    Recent Labs  08/31/13 0605 09/01/13 0720  WBC 13.3* 13.2*  RBC 4.77 4.78  HCT 43.2 43.4  PLT 194 177    Recent Labs  08/31/13 0605  NA 139  K 3.6  CL 101  CO2 31  BUN 16  CREATININE 1.32  GLUCOSE 117*  CALCIUM 8.4   No results found for this basename: LABPT, INR,  in the last 72 hours  Physical Exam:  Neurologically intact ABD soft Neurovascular intact Sensation intact distally Intact pulses distally Dorsiflexion/Plantar flexion intact Incision: dressing C/D/I and no drainage No cellulitis present Compartment soft  Assessment/Plan:  2 Days Post-Op Procedure(s) (LRB): TOTAL KNEE ARTHROPLASTY (Left) Advance diet Up with therapy Discharge home with home health ASA 325 bid x 2 weeks    Shane Cook 09/01/2013, 12:20 PM

## 2013-09-01 NOTE — Progress Notes (Signed)
Physical Therapy Treatment Patient Details Name: Shane Cook MRN: [id number] DOB: 02-Feb-1941 Today's Date: 09/01/2013 Time: 6962-9528 PT Time Calculation (min): 34 min  PT Assessment / Plan / Recommendation  History of Present Illness s/p elective LTKA   PT Comments   Pt cont's to progress well with mobility.  Very motivated.  Pt safe to d/c home from PT standpoint when MD feels medically ready.     Follow Up Recommendations  Home health PT;Supervision/Assistance - 24 hour     Does the patient have the potential to tolerate intense rehabilitation     Barriers to Discharge        Equipment Recommendations  Rolling walker with 5" wheels;3in1 (PT)    Recommendations for Other Services OT consult  Frequency 7X/week   Progress towards PT Goals Progress towards PT goals: Progressing toward goals  Plan Current plan remains appropriate    Precautions / Restrictions Precautions Precautions: Knee Restrictions Weight Bearing Restrictions: Yes LLE Weight Bearing: Weight bearing as tolerated   Pertinent Vitals/Pain "it's ok right now".  Premedicated.  Pt with questions Re: pain medication schedule.  Encouraged pt & wife to discuss with MD     Mobility  Bed Mobility Bed Mobility: Supine to Sit;Sitting - Scoot to Edge of Bed Supine to Sit: 5: Supervision;HOB flat Sitting - Scoot to Edge of Bed: 5: Supervision Transfers Transfers: Sit to Stand;Stand to Sit Sit to Stand: 5: Supervision;With upper extremity assist;From bed Stand to Sit: 5: Supervision;With upper extremity assist;With armrests;To chair/3-in-1 Details for Transfer Assistance: cues to reinforce safest hand placement Ambulation/Gait Ambulation/Gait Assistance: 5: Supervision Ambulation Distance (Feet): 400 Feet Assistive device: Rolling walker Ambulation/Gait Assistance Details: Pt progressing with fluidity of gait pattern.  Cont's to require cues for L terminal knee extension during stance phase Gait Pattern:  Step-through pattern Gait velocity: improving Stairs: Yes Stairs Assistance: 5: Supervision Stairs Assistance Details (indicate cue type and reason): Min cues for sequencing.  Performed 2x's.   Stair Management Technique: Two rails;Step to pattern;Forwards Number of Stairs: 5 (2x's) Wheelchair Mobility Wheelchair Mobility: No    Exercises Total Joint Exercises Ankle Circles/Pumps: AROM;Both;10 reps Quad Sets: AROM;Both;10 reps Heel Slides: Strengthening;AROM;Left;10 reps;Supine Straight Leg Raises: AROM;Strengthening;Left;10 reps     PT Goals (current goals can now be found in the care plan section) Acute Rehab PT Goals PT Goal Formulation: With patient Time For Goal Achievement: 09/06/13 Potential to Achieve Goals: Good  Visit Information  Last PT Received On: 09/01/13 Assistance Needed: +1 History of Present Illness: s/p elective LTKA    Subjective Data      Cognition  Cognition Arousal/Alertness: Awake/alert Behavior During Therapy: WFL for tasks assessed/performed Overall Cognitive Status: Within Functional Limits for tasks assessed    Balance     End of Session PT - End of Session Equipment Utilized During Treatment: Gait belt Activity Tolerance: Patient tolerated treatment well Patient left: in chair;with call bell/phone within reach;with family/visitor present Nurse Communication: Mobility status CPM Left Knee CPM Left Knee: Off   GP     Lara Mulch 09/01/2013, 11:42 AM  Verdell Face, PTA [phone] 09/01/2013

## 2013-09-02 ENCOUNTER — Encounter (HOSPITAL_COMMUNITY): Payer: Self-pay | Admitting: Orthopaedic Surgery

## 2013-09-02 DIAGNOSIS — J449 Chronic obstructive pulmonary disease, unspecified: Secondary | ICD-10-CM | POA: Diagnosis not present

## 2013-09-02 DIAGNOSIS — Z96659 Presence of unspecified artificial knee joint: Secondary | ICD-10-CM | POA: Diagnosis not present

## 2013-09-02 DIAGNOSIS — Z471 Aftercare following joint replacement surgery: Secondary | ICD-10-CM | POA: Diagnosis not present

## 2013-09-02 DIAGNOSIS — IMO0001 Reserved for inherently not codable concepts without codable children: Secondary | ICD-10-CM | POA: Diagnosis not present

## 2013-09-02 DIAGNOSIS — I10 Essential (primary) hypertension: Secondary | ICD-10-CM | POA: Diagnosis not present

## 2013-09-05 DIAGNOSIS — J449 Chronic obstructive pulmonary disease, unspecified: Secondary | ICD-10-CM | POA: Diagnosis not present

## 2013-09-05 DIAGNOSIS — I10 Essential (primary) hypertension: Secondary | ICD-10-CM | POA: Diagnosis not present

## 2013-09-05 DIAGNOSIS — Z96659 Presence of unspecified artificial knee joint: Secondary | ICD-10-CM | POA: Diagnosis not present

## 2013-09-05 DIAGNOSIS — Z471 Aftercare following joint replacement surgery: Secondary | ICD-10-CM | POA: Diagnosis not present

## 2013-09-05 DIAGNOSIS — IMO0001 Reserved for inherently not codable concepts without codable children: Secondary | ICD-10-CM | POA: Diagnosis not present

## 2013-09-06 DIAGNOSIS — J449 Chronic obstructive pulmonary disease, unspecified: Secondary | ICD-10-CM | POA: Diagnosis not present

## 2013-09-06 DIAGNOSIS — IMO0001 Reserved for inherently not codable concepts without codable children: Secondary | ICD-10-CM | POA: Diagnosis not present

## 2013-09-06 DIAGNOSIS — I10 Essential (primary) hypertension: Secondary | ICD-10-CM | POA: Diagnosis not present

## 2013-09-06 DIAGNOSIS — Z96659 Presence of unspecified artificial knee joint: Secondary | ICD-10-CM | POA: Diagnosis not present

## 2013-09-06 DIAGNOSIS — Z471 Aftercare following joint replacement surgery: Secondary | ICD-10-CM | POA: Diagnosis not present

## 2013-09-07 DIAGNOSIS — Z96659 Presence of unspecified artificial knee joint: Secondary | ICD-10-CM | POA: Diagnosis not present

## 2013-09-07 DIAGNOSIS — Z471 Aftercare following joint replacement surgery: Secondary | ICD-10-CM | POA: Diagnosis not present

## 2013-09-07 DIAGNOSIS — J449 Chronic obstructive pulmonary disease, unspecified: Secondary | ICD-10-CM | POA: Diagnosis not present

## 2013-09-07 DIAGNOSIS — IMO0001 Reserved for inherently not codable concepts without codable children: Secondary | ICD-10-CM | POA: Diagnosis not present

## 2013-09-07 DIAGNOSIS — I10 Essential (primary) hypertension: Secondary | ICD-10-CM | POA: Diagnosis not present

## 2013-09-08 DIAGNOSIS — IMO0001 Reserved for inherently not codable concepts without codable children: Secondary | ICD-10-CM | POA: Diagnosis not present

## 2013-09-08 DIAGNOSIS — Z96659 Presence of unspecified artificial knee joint: Secondary | ICD-10-CM | POA: Diagnosis not present

## 2013-09-08 DIAGNOSIS — I10 Essential (primary) hypertension: Secondary | ICD-10-CM | POA: Diagnosis not present

## 2013-09-08 DIAGNOSIS — J449 Chronic obstructive pulmonary disease, unspecified: Secondary | ICD-10-CM | POA: Diagnosis not present

## 2013-09-08 DIAGNOSIS — Z471 Aftercare following joint replacement surgery: Secondary | ICD-10-CM | POA: Diagnosis not present

## 2013-09-09 DIAGNOSIS — Z471 Aftercare following joint replacement surgery: Secondary | ICD-10-CM | POA: Diagnosis not present

## 2013-09-09 DIAGNOSIS — IMO0001 Reserved for inherently not codable concepts without codable children: Secondary | ICD-10-CM | POA: Diagnosis not present

## 2013-09-09 DIAGNOSIS — Z96659 Presence of unspecified artificial knee joint: Secondary | ICD-10-CM | POA: Diagnosis not present

## 2013-09-09 DIAGNOSIS — J449 Chronic obstructive pulmonary disease, unspecified: Secondary | ICD-10-CM | POA: Diagnosis not present

## 2013-09-09 DIAGNOSIS — I10 Essential (primary) hypertension: Secondary | ICD-10-CM | POA: Diagnosis not present

## 2013-09-11 DIAGNOSIS — Z96659 Presence of unspecified artificial knee joint: Secondary | ICD-10-CM | POA: Diagnosis not present

## 2013-09-11 DIAGNOSIS — J449 Chronic obstructive pulmonary disease, unspecified: Secondary | ICD-10-CM | POA: Diagnosis not present

## 2013-09-11 DIAGNOSIS — IMO0001 Reserved for inherently not codable concepts without codable children: Secondary | ICD-10-CM | POA: Diagnosis not present

## 2013-09-11 DIAGNOSIS — I10 Essential (primary) hypertension: Secondary | ICD-10-CM | POA: Diagnosis not present

## 2013-09-11 DIAGNOSIS — Z471 Aftercare following joint replacement surgery: Secondary | ICD-10-CM | POA: Diagnosis not present

## 2013-09-12 DIAGNOSIS — M1710 Unilateral primary osteoarthritis, unspecified knee: Secondary | ICD-10-CM | POA: Diagnosis not present

## 2013-09-13 DIAGNOSIS — I10 Essential (primary) hypertension: Secondary | ICD-10-CM | POA: Diagnosis not present

## 2013-09-13 DIAGNOSIS — J449 Chronic obstructive pulmonary disease, unspecified: Secondary | ICD-10-CM | POA: Diagnosis not present

## 2013-09-13 DIAGNOSIS — IMO0001 Reserved for inherently not codable concepts without codable children: Secondary | ICD-10-CM | POA: Diagnosis not present

## 2013-09-13 DIAGNOSIS — Z471 Aftercare following joint replacement surgery: Secondary | ICD-10-CM | POA: Diagnosis not present

## 2013-09-13 DIAGNOSIS — Z96659 Presence of unspecified artificial knee joint: Secondary | ICD-10-CM | POA: Diagnosis not present

## 2013-09-14 DIAGNOSIS — Z96659 Presence of unspecified artificial knee joint: Secondary | ICD-10-CM | POA: Diagnosis not present

## 2013-09-14 DIAGNOSIS — Z471 Aftercare following joint replacement surgery: Secondary | ICD-10-CM | POA: Diagnosis not present

## 2013-09-14 DIAGNOSIS — IMO0001 Reserved for inherently not codable concepts without codable children: Secondary | ICD-10-CM | POA: Diagnosis not present

## 2013-09-14 DIAGNOSIS — J449 Chronic obstructive pulmonary disease, unspecified: Secondary | ICD-10-CM | POA: Diagnosis not present

## 2013-09-14 DIAGNOSIS — I10 Essential (primary) hypertension: Secondary | ICD-10-CM | POA: Diagnosis not present

## 2013-09-15 DIAGNOSIS — Z96659 Presence of unspecified artificial knee joint: Secondary | ICD-10-CM | POA: Diagnosis not present

## 2013-09-15 DIAGNOSIS — Z471 Aftercare following joint replacement surgery: Secondary | ICD-10-CM | POA: Diagnosis not present

## 2013-09-15 DIAGNOSIS — I10 Essential (primary) hypertension: Secondary | ICD-10-CM | POA: Diagnosis not present

## 2013-09-15 DIAGNOSIS — J449 Chronic obstructive pulmonary disease, unspecified: Secondary | ICD-10-CM | POA: Diagnosis not present

## 2013-09-15 DIAGNOSIS — IMO0001 Reserved for inherently not codable concepts without codable children: Secondary | ICD-10-CM | POA: Diagnosis not present

## 2013-09-16 DIAGNOSIS — J449 Chronic obstructive pulmonary disease, unspecified: Secondary | ICD-10-CM | POA: Diagnosis not present

## 2013-09-16 DIAGNOSIS — IMO0001 Reserved for inherently not codable concepts without codable children: Secondary | ICD-10-CM | POA: Diagnosis not present

## 2013-09-16 DIAGNOSIS — Z96659 Presence of unspecified artificial knee joint: Secondary | ICD-10-CM | POA: Diagnosis not present

## 2013-09-16 DIAGNOSIS — Z471 Aftercare following joint replacement surgery: Secondary | ICD-10-CM | POA: Diagnosis not present

## 2013-09-16 DIAGNOSIS — I10 Essential (primary) hypertension: Secondary | ICD-10-CM | POA: Diagnosis not present

## 2013-09-19 DIAGNOSIS — Z96659 Presence of unspecified artificial knee joint: Secondary | ICD-10-CM | POA: Diagnosis not present

## 2013-09-19 DIAGNOSIS — M25569 Pain in unspecified knee: Secondary | ICD-10-CM | POA: Diagnosis not present

## 2013-09-23 DIAGNOSIS — Z96659 Presence of unspecified artificial knee joint: Secondary | ICD-10-CM | POA: Diagnosis not present

## 2013-09-23 DIAGNOSIS — M25569 Pain in unspecified knee: Secondary | ICD-10-CM | POA: Diagnosis not present

## 2013-09-27 DIAGNOSIS — M25569 Pain in unspecified knee: Secondary | ICD-10-CM | POA: Diagnosis not present

## 2013-09-27 DIAGNOSIS — Z96659 Presence of unspecified artificial knee joint: Secondary | ICD-10-CM | POA: Diagnosis not present

## 2013-09-29 ENCOUNTER — Other Ambulatory Visit: Payer: Self-pay

## 2013-09-29 DIAGNOSIS — M25569 Pain in unspecified knee: Secondary | ICD-10-CM | POA: Diagnosis not present

## 2013-09-29 DIAGNOSIS — Z96659 Presence of unspecified artificial knee joint: Secondary | ICD-10-CM | POA: Diagnosis not present

## 2013-10-04 DIAGNOSIS — Z96659 Presence of unspecified artificial knee joint: Secondary | ICD-10-CM | POA: Diagnosis not present

## 2013-10-04 DIAGNOSIS — M25569 Pain in unspecified knee: Secondary | ICD-10-CM | POA: Diagnosis not present

## 2013-10-06 DIAGNOSIS — M25569 Pain in unspecified knee: Secondary | ICD-10-CM | POA: Diagnosis not present

## 2013-10-06 DIAGNOSIS — Z96659 Presence of unspecified artificial knee joint: Secondary | ICD-10-CM | POA: Diagnosis not present

## 2013-10-11 DIAGNOSIS — M25569 Pain in unspecified knee: Secondary | ICD-10-CM | POA: Diagnosis not present

## 2013-10-13 DIAGNOSIS — Z96659 Presence of unspecified artificial knee joint: Secondary | ICD-10-CM | POA: Diagnosis not present

## 2013-10-13 DIAGNOSIS — M25569 Pain in unspecified knee: Secondary | ICD-10-CM | POA: Diagnosis not present

## 2013-10-18 DIAGNOSIS — M25569 Pain in unspecified knee: Secondary | ICD-10-CM | POA: Diagnosis not present

## 2013-10-18 DIAGNOSIS — Z96659 Presence of unspecified artificial knee joint: Secondary | ICD-10-CM | POA: Diagnosis not present

## 2013-10-25 DIAGNOSIS — M25569 Pain in unspecified knee: Secondary | ICD-10-CM | POA: Diagnosis not present

## 2013-10-25 DIAGNOSIS — Z96659 Presence of unspecified artificial knee joint: Secondary | ICD-10-CM | POA: Diagnosis not present

## 2013-11-08 ENCOUNTER — Other Ambulatory Visit: Payer: Self-pay | Admitting: Family Medicine

## 2013-11-08 DIAGNOSIS — L989 Disorder of the skin and subcutaneous tissue, unspecified: Secondary | ICD-10-CM

## 2013-11-09 ENCOUNTER — Ambulatory Visit
Admission: RE | Admit: 2013-11-09 | Discharge: 2013-11-09 | Disposition: A | Discharge status: Home / Self Care | Payer: Medicare Other | Source: Ambulatory Visit | Attending: Family Medicine | Admitting: Family Medicine

## 2013-11-09 DIAGNOSIS — M7989 Other specified soft tissue disorders: Secondary | ICD-10-CM | POA: Diagnosis not present

## 2013-11-09 DIAGNOSIS — L989 Disorder of the skin and subcutaneous tissue, unspecified: Secondary | ICD-10-CM

## 2013-11-28 DIAGNOSIS — M25569 Pain in unspecified knee: Secondary | ICD-10-CM | POA: Diagnosis not present

## 2013-11-28 DIAGNOSIS — R972 Elevated prostate specific antigen [PSA]: Secondary | ICD-10-CM | POA: Diagnosis not present

## 2013-11-28 DIAGNOSIS — E291 Testicular hypofunction: Secondary | ICD-10-CM | POA: Diagnosis not present

## 2013-12-05 DIAGNOSIS — N529 Male erectile dysfunction, unspecified: Secondary | ICD-10-CM | POA: Diagnosis not present

## 2013-12-05 DIAGNOSIS — R972 Elevated prostate specific antigen [PSA]: Secondary | ICD-10-CM | POA: Diagnosis not present

## 2013-12-05 DIAGNOSIS — E291 Testicular hypofunction: Secondary | ICD-10-CM | POA: Diagnosis not present

## 2014-01-27 DIAGNOSIS — M25569 Pain in unspecified knee: Secondary | ICD-10-CM | POA: Diagnosis not present

## 2014-02-06 DIAGNOSIS — E7800 Pure hypercholesterolemia, unspecified: Secondary | ICD-10-CM | POA: Diagnosis not present

## 2014-02-06 DIAGNOSIS — I10 Essential (primary) hypertension: Secondary | ICD-10-CM | POA: Diagnosis not present

## 2014-02-13 DIAGNOSIS — Z23 Encounter for immunization: Secondary | ICD-10-CM | POA: Diagnosis not present

## 2014-02-13 DIAGNOSIS — Z79899 Other long term (current) drug therapy: Secondary | ICD-10-CM | POA: Diagnosis not present

## 2014-02-13 DIAGNOSIS — Z0000 Encounter for general adult medical examination without abnormal findings: Secondary | ICD-10-CM | POA: Diagnosis not present

## 2014-02-13 DIAGNOSIS — E7800 Pure hypercholesterolemia, unspecified: Secondary | ICD-10-CM | POA: Diagnosis not present

## 2014-02-13 DIAGNOSIS — I10 Essential (primary) hypertension: Secondary | ICD-10-CM | POA: Diagnosis not present

## 2014-02-27 DIAGNOSIS — H04129 Dry eye syndrome of unspecified lacrimal gland: Secondary | ICD-10-CM | POA: Diagnosis not present

## 2014-02-27 DIAGNOSIS — Z961 Presence of intraocular lens: Secondary | ICD-10-CM | POA: Diagnosis not present

## 2014-03-07 DIAGNOSIS — J328 Other chronic sinusitis: Secondary | ICD-10-CM | POA: Diagnosis not present

## 2014-03-07 DIAGNOSIS — J449 Chronic obstructive pulmonary disease, unspecified: Secondary | ICD-10-CM | POA: Diagnosis not present

## 2014-03-07 DIAGNOSIS — J310 Chronic rhinitis: Secondary | ICD-10-CM | POA: Diagnosis not present

## 2014-03-09 DIAGNOSIS — H903 Sensorineural hearing loss, bilateral: Secondary | ICD-10-CM | POA: Diagnosis not present

## 2014-05-30 DIAGNOSIS — R972 Elevated prostate specific antigen [PSA]: Secondary | ICD-10-CM | POA: Diagnosis not present

## 2014-05-30 DIAGNOSIS — E291 Testicular hypofunction: Secondary | ICD-10-CM | POA: Diagnosis not present

## 2014-06-05 DIAGNOSIS — N320 Bladder-neck obstruction: Secondary | ICD-10-CM | POA: Diagnosis not present

## 2014-06-05 DIAGNOSIS — R972 Elevated prostate specific antigen [PSA]: Secondary | ICD-10-CM | POA: Diagnosis not present

## 2014-06-05 DIAGNOSIS — E291 Testicular hypofunction: Secondary | ICD-10-CM | POA: Diagnosis not present

## 2014-06-05 DIAGNOSIS — N529 Male erectile dysfunction, unspecified: Secondary | ICD-10-CM | POA: Diagnosis not present

## 2014-10-11 DIAGNOSIS — M1711 Unilateral primary osteoarthritis, right knee: Secondary | ICD-10-CM | POA: Diagnosis not present

## 2014-10-11 DIAGNOSIS — Z96652 Presence of left artificial knee joint: Secondary | ICD-10-CM | POA: Diagnosis not present

## 2014-11-12 ENCOUNTER — Emergency Department (HOSPITAL_COMMUNITY)
Admission: EM | Admit: 2014-11-12 | Discharge: 2014-11-12 | Disposition: A | Discharge status: Home / Self Care | Payer: Medicare Other | Attending: Emergency Medicine | Admitting: Emergency Medicine

## 2014-11-12 ENCOUNTER — Encounter (HOSPITAL_COMMUNITY): Payer: Self-pay | Admitting: Emergency Medicine

## 2014-11-12 DIAGNOSIS — I10 Essential (primary) hypertension: Secondary | ICD-10-CM | POA: Insufficient documentation

## 2014-11-12 DIAGNOSIS — R319 Hematuria, unspecified: Secondary | ICD-10-CM | POA: Diagnosis not present

## 2014-11-12 DIAGNOSIS — E780 Pure hypercholesterolemia: Secondary | ICD-10-CM | POA: Insufficient documentation

## 2014-11-12 DIAGNOSIS — Z7982 Long term (current) use of aspirin: Secondary | ICD-10-CM | POA: Diagnosis not present

## 2014-11-12 DIAGNOSIS — Z87448 Personal history of other diseases of urinary system: Secondary | ICD-10-CM | POA: Diagnosis not present

## 2014-11-12 DIAGNOSIS — J45909 Unspecified asthma, uncomplicated: Secondary | ICD-10-CM | POA: Diagnosis not present

## 2014-11-12 DIAGNOSIS — M1990 Unspecified osteoarthritis, unspecified site: Secondary | ICD-10-CM | POA: Insufficient documentation

## 2014-11-12 DIAGNOSIS — Z880 Allergy status to penicillin: Secondary | ICD-10-CM | POA: Diagnosis not present

## 2014-11-12 DIAGNOSIS — R011 Cardiac murmur, unspecified: Secondary | ICD-10-CM | POA: Insufficient documentation

## 2014-11-12 DIAGNOSIS — Z87891 Personal history of nicotine dependence: Secondary | ICD-10-CM | POA: Diagnosis not present

## 2014-11-12 DIAGNOSIS — Z79899 Other long term (current) drug therapy: Secondary | ICD-10-CM | POA: Diagnosis not present

## 2014-11-12 DIAGNOSIS — R21 Rash and other nonspecific skin eruption: Secondary | ICD-10-CM | POA: Insufficient documentation

## 2014-11-12 LAB — URINALYSIS, ROUTINE W REFLEX MICROSCOPIC
Bilirubin Urine: NEGATIVE
Glucose, UA: NEGATIVE mg/dL
Ketones, ur: NEGATIVE mg/dL
Leukocytes, UA: NEGATIVE
Nitrite: NEGATIVE
Protein, ur: NEGATIVE mg/dL
SPECIFIC GRAVITY, URINE: 1.018 (ref 1.005–1.030)
UROBILINOGEN UA: 0.2 mg/dL (ref 0.0–1.0)
pH: 6 (ref 5.0–8.0)

## 2014-11-12 LAB — CBC WITH DIFFERENTIAL/PLATELET
Basophils Absolute: 0 10*3/uL (ref 0.0–0.1)
Basophils Relative: 1 % (ref 0–1)
EOS PCT: 3 % (ref 0–5)
Eosinophils Absolute: 0.2 10*3/uL (ref 0.0–0.7)
HCT: 50.8 % (ref 39.0–52.0)
Hemoglobin: 16.8 g/dL (ref 13.0–17.0)
LYMPHS ABS: 1.7 10*3/uL (ref 0.7–4.0)
LYMPHS PCT: 21 % (ref 12–46)
MCH: 30.9 pg (ref 26.0–34.0)
MCHC: 33.1 g/dL (ref 30.0–36.0)
MCV: 93.4 fL (ref 78.0–100.0)
Monocytes Absolute: 0.8 10*3/uL (ref 0.1–1.0)
Monocytes Relative: 9 % (ref 3–12)
NEUTROS PCT: 66 % (ref 43–77)
Neutro Abs: 5.5 10*3/uL (ref 1.7–7.7)
Platelets: 231 10*3/uL (ref 150–400)
RBC: 5.44 MIL/uL (ref 4.22–5.81)
RDW: 13.1 % (ref 11.5–15.5)
WBC: 8.2 10*3/uL (ref 4.0–10.5)

## 2014-11-12 LAB — I-STAT CHEM 8, ED
BUN: 25 mg/dL — ABNORMAL HIGH (ref 6–23)
CALCIUM ION: 1.09 mmol/L — AB (ref 1.13–1.30)
CHLORIDE: 100 meq/L (ref 96–112)
CREATININE: 1.3 mg/dL (ref 0.50–1.35)
GLUCOSE: 105 mg/dL — AB (ref 70–99)
HEMATOCRIT: 53 % — AB (ref 39.0–52.0)
Hemoglobin: 18 g/dL — ABNORMAL HIGH (ref 13.0–17.0)
Potassium: 4.3 mEq/L (ref 3.7–5.3)
Sodium: 141 mEq/L (ref 137–147)
TCO2: 28 mmol/L (ref 0–100)

## 2014-11-12 LAB — URINE MICROSCOPIC-ADD ON

## 2014-11-12 NOTE — Discharge Instructions (Signed)
Hematuria Call Dr Geryl Councilman office tomorrow to schedule next available appointment. Tell office staff that you were seen here today when scheduling the appointment. Use lotrimin cream on the rash on your leg as directed until rash is gone Hematuria is blood in your urine. It can be caused by a bladder infection, kidney infection, prostate infection, kidney stone, or cancer of your urinary tract. Infections can usually be treated with medicine, and a kidney stone usually will pass through your urine. If neither of these is the cause of your hematuria, further workup to find out the reason may be needed. It is very important that you tell your health care provider about any blood you see in your urine, even if the blood stops without treatment or happens without causing pain. Blood in your urine that happens and then stops and then happens again can be a symptom of a very serious condition. Also, pain is not a symptom in the initial stages of many urinary cancers. HOME CARE INSTRUCTIONS   Drink lots of fluid, 3-4 quarts a day. If you have been diagnosed with an infection, cranberry juice is especially recommended, in addition to large amounts of water.  Avoid caffeine, tea, and carbonated beverages because they tend to irritate the bladder.  Avoid alcohol because it may irritate the prostate.  Take all medicines as directed by your health care provider.  If you were prescribed an antibiotic medicine, finish it all even if you start to feel better.  If you have been diagnosed with a kidney stone, follow your health care provider's instructions regarding straining your urine to catch the stone.  Empty your bladder often. Avoid holding urine for long periods of time.  After a bowel movement, women should cleanse front to back. Use each tissue only once.  Empty your bladder before and after sexual intercourse if you are a male. SEEK MEDICAL CARE IF:  You develop back pain.  You have a  fever.  You have a feeling of sickness in your stomach (nausea) or vomiting.  Your symptoms are not better in 3 days. Return sooner if you are getting worse. SEEK IMMEDIATE MEDICAL CARE IF:   You develop severe vomiting and are unable to keep the medicine down.  You develop severe back or abdominal pain despite taking your medicines.  You begin passing a large amount of blood or clots in your urine.  You feel extremely weak or faint, or you pass out. MAKE SURE YOU:   Understand these instructions.  Will watch your condition.  Will get help right away if you are not doing well or get worse. Document Released: 11/10/2005 Document Revised: 03/27/2014 Document Reviewed: 07/11/2013 [facility] Patient Information 2015 [place], [place]. This information is not intended to replace advice given to you by your health care provider. Make sure you discuss any questions you have with your health care provider. Body Ringworm Ringworm (tinea corporis) is a fungal infection of the skin on the body. This infection is not caused by worms, but is actually caused by a fungus. Fungus normally lives on the top of your skin and can be useful. However, in the case of ringworms, the fungus grows out of control and causes a skin infection. It can involve any area of skin on the body and can spread easily from one person to another (contagious). Ringworm is a common problem for children, but it can affect adults as well. Ringworm is also often found in athletes, especially wrestlers who share equipment and mats.  CAUSES  Ringworm of the body is caused by a fungus called dermatophyte. It can spread by:  Touchingother people who are infected.  Touchinginfected pets.  Touching or sharingobjects that have been in contact with the infected person or pet (hats, combs, towels, clothing, sports equipment). SYMPTOMS   Itchy, raised red spots and bumps on the skin.  Ring-shaped rash.  Redness near the border of  the rash with a clear center.  Dry and scaly skin on or around the rash. Not every person develops a ring-shaped rash. Some develop only the red, scaly patches. DIAGNOSIS  Most often, ringworm can be diagnosed by performing a skin exam. Your caregiver may choose to take a skin scraping from the affected area. The sample will be examined under the microscope to see if the fungus is present.  TREATMENT  Body ringworm may be treated with a topical antifungal cream or ointment. Sometimes, an antifungal shampoo that can be used on your body is prescribed. You may be prescribed antifungal medicines to take by mouth if your ringworm is severe, keeps coming back, or lasts a long time.  HOME CARE INSTRUCTIONS   Only take over-the-counter or prescription medicines as directed by your caregiver.  Wash the infected area and dry it completely before applying yourcream or ointment.  When using antifungal shampoo to treat the ringworm, leave the shampoo on the body for 3-5 minutes before rinsing.   Wear loose clothing to stop clothes from rubbing and irritating the rash.  Wash or change your bed sheets every night while you have the rash.  Have your pet treated by your veterinarian if it has the same infection. To prevent ringworm:   Practice good hygiene.  Wear sandals or shoes in public places and showers.  Do not share personal items with others.  Avoid touching red patches of skin on other people.  Avoid touching pets that have bald spots or wash your hands after doing so. SEEK MEDICAL CARE IF:   Your rash continues to spread after 7 days of treatment.  Your rash is not gone in 4 weeks.  The area around your rash becomes red, warm, tender, and swollen. Document Released: 11/07/2000 Document Revised: 08/04/2012 Document Reviewed: 05/24/2012 [facility] Patient Information 2015 [place], [place]. This information is not intended to replace advice given to you by your health care provider.  Make sure you discuss any questions you have with your health care provider.

## 2014-11-12 NOTE — ED Notes (Signed)
Pt c/o sudden urinary urgency today, pt felt he was unable to hold his bladder and so firmly grasped his penis in order to prevent urinary incontinency. Later today, pt voided and noticed a blood clot and blood in the urine. Pt has Hx of multiple episodes of infected prostate and passed blood in his urine with all 3 episodes.

## 2014-11-12 NOTE — ED Provider Notes (Addendum)
CSN: [id number]     Arrival date & time 11/12/14  1806 History   First MD Initiated Contact with Patient 11/12/14 1809     No chief complaint on file.   Chief complaint hematuria (Consider location/radiation/quality/duration/timing/severity/associated sxs/prior Treatment) HPI Patient reports that he needed to urinate badly 12 noon today, so he forcibly grabbed distal end of his penis  To prevent urinating on himself.He then urinated normally behind a building. At 3 PM patient needed to urinate again he had slight difficulty in getting his stream started. He then reports that a clot came out of his penis followed by gross blood. He presently feels well. Asymptomatic. Patient went to urgent care center for evaluation he was sent here for further evaluation. Nice pain with urination no fever no abdominal pain no other associated symptoms Past Medical History  Diagnosis Date  . Hypercholesterolemia   . Low testosterone   . Edema     takes hydrochlorothiazide  . Hypertension   . Asthma     no problems recently  . Heart murmur     hx yrs ago  . Nephritis     73 yrs old  hospitalized. no problem since  . Arthritis    Past Surgical History  Procedure Laterality Date  . Hernia repair    . Achilles tendon repair Right   . Mouth surgery      cyst 73 yrs old ?ethmoid  . Rotator cuff repair      bilat 20 years apart  . Knee arthroscopy      left  . Cataract extraction      bilateral  . Tonsillectomy    . Peyronies  08?    penis bent/  . Total knee arthroplasty Left 08/30/2013    Dr Rhona Raider  . Total knee arthroplasty Left 08/30/2013    Procedure: TOTAL KNEE ARTHROPLASTY;  Surgeon: Hessie Dibble, MD;  Location: [facility];  Service: Orthopedics;  Laterality: Left;   Family History  Problem Relation Age of Onset  . Colon cancer Neg Hx    History  Substance Use Topics  . Smoking status: Former Smoker -- 2.00 packs/day for 21 years    Types: Cigarettes    Quit date: 05/28/1975  .  Smokeless tobacco: Never Used  . Alcohol Use: No    Review of Systems  Constitutional: Negative.   HENT: Negative.   Respiratory: Negative.   Cardiovascular: Negative.   Gastrointestinal: Negative.   Genitourinary: Positive for hematuria.  Musculoskeletal: Negative.   Skin: Positive for rash.       Rash on left thigh for 3 weeks  Neurological: Negative.   Psychiatric/Behavioral: Negative.   All other systems reviewed and are negative.     Allergies  Sulfa antibiotics and Amoxicillin  Home Medications   Prior to Admission medications   Medication Sig Start Date End Date Taking? Authorizing Provider  acetaminophen (TYLENOL) 325 MG tablet Take 650 mg by mouth every 4 (four) hours as needed for pain.     Historical Provider, MD  albuterol (PROVENTIL HFA;VENTOLIN HFA) 108 (90 BASE) MCG/ACT inhaler Inhale 2 puffs into the lungs every 4 (four) hours as needed for shortness of breath.     Historical Provider, MD  aspirin EC 325 MG EC tablet Take 1 tablet (325 mg total) by mouth 2 (two) times daily. 09/01/13   Dione Housekeeper, PA-C  atorvastatin (LIPITOR) 40 MG tablet Take 40 mg by mouth daily.    Historical Provider, MD  beta carotene w/minerals (  OCUVITE) tablet Take 1 tablet by mouth daily.    Historical Provider, MD  cetirizine (KLS ALLER-TEC) 10 MG tablet Take 10 mg by mouth daily.    Historical Provider, MD  Cholecalciferol (VITAMIN D) 2000 UNITS tablet Take 2,000 Units by mouth daily.    Historical Provider, MD  fluticasone (FLONASE) 50 MCG/ACT nasal spray Place 2 sprays into the nose daily as needed for rhinitis or allergies.  09/01/12   Historical Provider, MD  hydrochlorothiazide (HYDRODIURIL) 25 MG tablet Take 25 mg by mouth daily.    Historical Provider, MD  HYDROcodone-acetaminophen (NORCO) 7.5-325 MG per tablet Take 1-2 tablets by mouth every 4 (four) hours as needed. 09/01/13   Dione Housekeeper, PA-C  methocarbamol (ROBAXIN) 500 MG tablet Take 1 tablet (500 mg total) by  mouth every 6 (six) hours as needed. 09/01/13   Dione Housekeeper, PA-C  Misc Natural Products (PROSTATE HEALTH PO) Take 1 capsule by mouth daily.    Historical Provider, MD  sildenafil (VIAGRA) 100 MG tablet Take 100 mg by mouth daily as needed for erectile dysfunction.     Historical Provider, MD  testosterone cypionate (DEPOTESTOTERONE CYPIONATE) 200 MG/ML injection Inject 100 mg into the muscle once a week. Takes on Saturdays    Historical Provider, MD   BP 118/67 mmHg  Pulse 77  Temp(Src) 97.5 F (36.4 C) (Oral)  Resp 16  SpO2 94% Physical Exam  Constitutional: He appears well-developed and well-nourished.  HENT:  Head: Normocephalic and atraumatic.  Eyes: Conjunctivae are normal. Pupils are equal, round, and reactive to light.  Neck: Neck supple. No tracheal deviation present. No thyromegaly present.  Cardiovascular: Normal rate and regular rhythm.   No murmur heard. Pulmonary/Chest: Effort normal and breath sounds normal.  Abdominal: Soft. Bowel sounds are normal. He exhibits no distension. There is no tenderness.  Genitourinary: Rectum normal, prostate normal and penis normal.  No gross blood on rectal exam penis is circumcised no blood at meatus no signs of trauma  Musculoskeletal: Normal range of motion. He exhibits no edema or tenderness.  Neurological: He is alert. Coordination normal.  Skin: Skin is warm and dry. No rash noted.  3 cm round lesion on left anteromedial thigh with pale center and heaped up margins consistent with tinea corporis  Psychiatric: He has a normal mood and affect.  Nursing note and vitals reviewed.   ED Course  Procedures (including critical care time) Labs Review Labs Reviewed  URINALYSIS, ROUTINE W REFLEX MICROSCOPIC    Imaging Review No results found.   EKG Interpretation None     8 PM patient remains asymptomatic Results for orders placed or performed during the hospital encounter of 11/12/14  U/A (may I&O cath if menses)   Result Value Ref Range   Color, Urine YELLOW YELLOW   APPearance CLOUDY (A) CLEAR   Specific Gravity, Urine 1.018 1.005 - 1.030   pH 6.0 5.0 - 8.0   Glucose, UA NEGATIVE NEGATIVE mg/dL   Hgb urine dipstick LARGE (A) NEGATIVE   Bilirubin Urine NEGATIVE NEGATIVE   Ketones, ur NEGATIVE NEGATIVE mg/dL   Protein, ur NEGATIVE NEGATIVE mg/dL   Urobilinogen, UA 0.2 0.0 - 1.0 mg/dL   Nitrite NEGATIVE NEGATIVE   Leukocytes, UA NEGATIVE NEGATIVE  CBC with Differential  Result Value Ref Range   WBC 8.2 4.0 - 10.5 K/uL   RBC 5.44 4.22 - 5.81 MIL/uL   Hemoglobin 16.8 13.0 - 17.0 g/dL   HCT 50.8 39.0 - 52.0 %   MCV 93.4  78.0 - 100.0 fL   MCH 30.9 26.0 - 34.0 pg   MCHC 33.1 30.0 - 36.0 g/dL   RDW 13.1 11.5 - 15.5 %   Platelets 231 150 - 400 K/uL   Neutrophils Relative % 66 43 - 77 %   Neutro Abs 5.5 1.7 - 7.7 K/uL   Lymphocytes Relative 21 12 - 46 %   Lymphs Abs 1.7 0.7 - 4.0 K/uL   Monocytes Relative 9 3 - 12 %   Monocytes Absolute 0.8 0.1 - 1.0 K/uL   Eosinophils Relative 3 0 - 5 %   Eosinophils Absolute 0.2 0.0 - 0.7 K/uL   Basophils Relative 1 0 - 1 %   Basophils Absolute 0.0 0.0 - 0.1 K/uL  Urine microscopic-add on  Result Value Ref Range   RBC / HPF TOO NUMEROUS TO COUNT <3 RBC/hpf   Urine-Other FIELD OBSCURED BY RBC'S   I-stat chem 8, ed  Result Value Ref Range   Sodium 141 137 - 147 mEq/L   Potassium 4.3 3.7 - 5.3 mEq/L   Chloride 100 96 - 112 mEq/L   BUN 25 (H) 6 - 23 mg/dL   Creatinine, Ser 1.30 0.50 - 1.35 mg/dL   Glucose, Bld 105 (H) 70 - 99 mg/dL   Calcium, Ion 1.09 (L) 1.13 - 1.30 mmol/L   TCO2 28 0 - 100 mmol/L   Hemoglobin 18.0 (H) 13.0 - 17.0 g/dL   HCT 53.0 (H) 39.0 - 52.0 %   No results found.  MDM   No further treatment needed for hematuria.Plan call Dr.Dahlstedt's office tomorrow to schedule next available appointment. Lotrimin cream for rash Diagnosis #1hematuria #2tinea corporis Final diagnoses:  None        Orlie Dakin, MD 11/12/14  2006  Orlie Dakin, MD 11/12/14 2007  Orlie Dakin, MD 11/12/14 2012  Orlie Dakin, MD 11/12/14 2014

## 2014-11-14 DIAGNOSIS — R3915 Urgency of urination: Secondary | ICD-10-CM | POA: Diagnosis not present

## 2014-11-14 DIAGNOSIS — R310 Gross hematuria: Secondary | ICD-10-CM | POA: Diagnosis not present

## 2014-12-01 DIAGNOSIS — N489 Disorder of penis, unspecified: Secondary | ICD-10-CM | POA: Diagnosis not present

## 2014-12-18 DIAGNOSIS — K7689 Other specified diseases of liver: Secondary | ICD-10-CM | POA: Diagnosis not present

## 2014-12-18 DIAGNOSIS — N3289 Other specified disorders of bladder: Secondary | ICD-10-CM | POA: Diagnosis not present

## 2014-12-18 DIAGNOSIS — R310 Gross hematuria: Secondary | ICD-10-CM | POA: Diagnosis not present

## 2014-12-18 DIAGNOSIS — N320 Bladder-neck obstruction: Secondary | ICD-10-CM | POA: Diagnosis not present

## 2014-12-22 DIAGNOSIS — N320 Bladder-neck obstruction: Secondary | ICD-10-CM | POA: Diagnosis not present

## 2014-12-22 DIAGNOSIS — R972 Elevated prostate specific antigen [PSA]: Secondary | ICD-10-CM | POA: Diagnosis not present

## 2014-12-22 DIAGNOSIS — N5201 Erectile dysfunction due to arterial insufficiency: Secondary | ICD-10-CM | POA: Diagnosis not present

## 2014-12-22 DIAGNOSIS — R310 Gross hematuria: Secondary | ICD-10-CM | POA: Diagnosis not present

## 2015-01-24 DIAGNOSIS — N5201 Erectile dysfunction due to arterial insufficiency: Secondary | ICD-10-CM | POA: Diagnosis not present

## 2015-02-02 DIAGNOSIS — S0501XA Injury of conjunctiva and corneal abrasion without foreign body, right eye, initial encounter: Secondary | ICD-10-CM | POA: Diagnosis not present

## 2015-02-06 DIAGNOSIS — L820 Inflamed seborrheic keratosis: Secondary | ICD-10-CM | POA: Diagnosis not present

## 2015-02-06 DIAGNOSIS — D485 Neoplasm of uncertain behavior of skin: Secondary | ICD-10-CM | POA: Diagnosis not present

## 2015-02-06 DIAGNOSIS — D225 Melanocytic nevi of trunk: Secondary | ICD-10-CM | POA: Diagnosis not present

## 2015-02-06 DIAGNOSIS — L821 Other seborrheic keratosis: Secondary | ICD-10-CM | POA: Diagnosis not present

## 2015-02-06 DIAGNOSIS — Z1283 Encounter for screening for malignant neoplasm of skin: Secondary | ICD-10-CM | POA: Diagnosis not present

## 2015-02-19 DIAGNOSIS — R319 Hematuria, unspecified: Secondary | ICD-10-CM | POA: Diagnosis not present

## 2015-02-19 DIAGNOSIS — Z5181 Encounter for therapeutic drug level monitoring: Secondary | ICD-10-CM | POA: Diagnosis not present

## 2015-02-19 DIAGNOSIS — R7301 Impaired fasting glucose: Secondary | ICD-10-CM | POA: Diagnosis not present

## 2015-02-19 DIAGNOSIS — I10 Essential (primary) hypertension: Secondary | ICD-10-CM | POA: Diagnosis not present

## 2015-02-19 DIAGNOSIS — E780 Pure hypercholesterolemia: Secondary | ICD-10-CM | POA: Diagnosis not present

## 2015-02-23 DIAGNOSIS — R5381 Other malaise: Secondary | ICD-10-CM | POA: Diagnosis not present

## 2015-02-23 DIAGNOSIS — I10 Essential (primary) hypertension: Secondary | ICD-10-CM | POA: Diagnosis not present

## 2015-02-23 DIAGNOSIS — D649 Anemia, unspecified: Secondary | ICD-10-CM | POA: Diagnosis not present

## 2015-02-23 DIAGNOSIS — Z8739 Personal history of other diseases of the musculoskeletal system and connective tissue: Secondary | ICD-10-CM | POA: Diagnosis not present

## 2015-02-23 DIAGNOSIS — N5201 Erectile dysfunction due to arterial insufficiency: Secondary | ICD-10-CM | POA: Diagnosis not present

## 2015-02-23 DIAGNOSIS — E780 Pure hypercholesterolemia: Secondary | ICD-10-CM | POA: Diagnosis not present

## 2015-02-23 DIAGNOSIS — Z0000 Encounter for general adult medical examination without abnormal findings: Secondary | ICD-10-CM | POA: Diagnosis not present

## 2015-02-23 DIAGNOSIS — J301 Allergic rhinitis due to pollen: Secondary | ICD-10-CM | POA: Diagnosis not present

## 2015-03-06 DIAGNOSIS — J300 Vasomotor rhinitis: Secondary | ICD-10-CM | POA: Diagnosis not present

## 2015-03-06 DIAGNOSIS — J449 Chronic obstructive pulmonary disease, unspecified: Secondary | ICD-10-CM | POA: Diagnosis not present

## 2015-04-09 DIAGNOSIS — Z961 Presence of intraocular lens: Secondary | ICD-10-CM | POA: Diagnosis not present

## 2015-05-11 DIAGNOSIS — R946 Abnormal results of thyroid function studies: Secondary | ICD-10-CM | POA: Diagnosis not present

## 2015-05-16 DIAGNOSIS — M549 Dorsalgia, unspecified: Secondary | ICD-10-CM | POA: Diagnosis not present

## 2015-05-16 DIAGNOSIS — E039 Hypothyroidism, unspecified: Secondary | ICD-10-CM | POA: Diagnosis not present

## 2015-05-29 DIAGNOSIS — R972 Elevated prostate specific antigen [PSA]: Secondary | ICD-10-CM | POA: Diagnosis not present

## 2015-06-01 DIAGNOSIS — N486 Induration penis plastica: Secondary | ICD-10-CM | POA: Diagnosis not present

## 2015-06-01 DIAGNOSIS — R972 Elevated prostate specific antigen [PSA]: Secondary | ICD-10-CM | POA: Diagnosis not present

## 2015-06-01 DIAGNOSIS — N5201 Erectile dysfunction due to arterial insufficiency: Secondary | ICD-10-CM | POA: Diagnosis not present

## 2015-08-09 DIAGNOSIS — E039 Hypothyroidism, unspecified: Secondary | ICD-10-CM | POA: Diagnosis not present

## 2015-08-09 DIAGNOSIS — M549 Dorsalgia, unspecified: Secondary | ICD-10-CM | POA: Diagnosis not present

## 2015-08-16 DIAGNOSIS — E039 Hypothyroidism, unspecified: Secondary | ICD-10-CM | POA: Diagnosis not present

## 2015-08-16 DIAGNOSIS — Z23 Encounter for immunization: Secondary | ICD-10-CM | POA: Diagnosis not present

## 2015-08-16 DIAGNOSIS — M720 Palmar fascial fibromatosis [Dupuytren]: Secondary | ICD-10-CM | POA: Diagnosis not present

## 2015-09-07 DIAGNOSIS — M791 Myalgia: Secondary | ICD-10-CM | POA: Diagnosis not present

## 2015-09-28 DIAGNOSIS — M25562 Pain in left knee: Secondary | ICD-10-CM | POA: Diagnosis not present

## 2015-09-28 DIAGNOSIS — M25512 Pain in left shoulder: Secondary | ICD-10-CM | POA: Diagnosis not present

## 2015-10-24 DIAGNOSIS — M25512 Pain in left shoulder: Secondary | ICD-10-CM | POA: Diagnosis not present

## 2015-11-15 DIAGNOSIS — E039 Hypothyroidism, unspecified: Secondary | ICD-10-CM | POA: Diagnosis not present

## 2015-11-15 DIAGNOSIS — Z23 Encounter for immunization: Secondary | ICD-10-CM | POA: Diagnosis not present

## 2016-01-07 DIAGNOSIS — E039 Hypothyroidism, unspecified: Secondary | ICD-10-CM | POA: Diagnosis not present

## 2016-02-27 DIAGNOSIS — E7800 Pure hypercholesterolemia, unspecified: Secondary | ICD-10-CM | POA: Diagnosis not present

## 2016-02-27 DIAGNOSIS — E039 Hypothyroidism, unspecified: Secondary | ICD-10-CM | POA: Diagnosis not present

## 2016-02-27 DIAGNOSIS — I10 Essential (primary) hypertension: Secondary | ICD-10-CM | POA: Diagnosis not present

## 2016-02-27 DIAGNOSIS — D649 Anemia, unspecified: Secondary | ICD-10-CM | POA: Diagnosis not present

## 2016-03-04 DIAGNOSIS — E039 Hypothyroidism, unspecified: Secondary | ICD-10-CM | POA: Diagnosis not present

## 2016-03-04 DIAGNOSIS — E7800 Pure hypercholesterolemia, unspecified: Secondary | ICD-10-CM | POA: Diagnosis not present

## 2016-03-04 DIAGNOSIS — Z0000 Encounter for general adult medical examination without abnormal findings: Secondary | ICD-10-CM | POA: Diagnosis not present

## 2016-03-04 DIAGNOSIS — I10 Essential (primary) hypertension: Secondary | ICD-10-CM | POA: Diagnosis not present

## 2016-03-12 DIAGNOSIS — J300 Vasomotor rhinitis: Secondary | ICD-10-CM | POA: Diagnosis not present

## 2016-03-12 DIAGNOSIS — J449 Chronic obstructive pulmonary disease, unspecified: Secondary | ICD-10-CM | POA: Diagnosis not present

## 2016-05-29 DIAGNOSIS — E291 Testicular hypofunction: Secondary | ICD-10-CM | POA: Diagnosis not present

## 2016-05-29 DIAGNOSIS — N401 Enlarged prostate with lower urinary tract symptoms: Secondary | ICD-10-CM | POA: Diagnosis not present

## 2016-06-02 DIAGNOSIS — N5201 Erectile dysfunction due to arterial insufficiency: Secondary | ICD-10-CM | POA: Diagnosis not present

## 2016-06-02 DIAGNOSIS — E291 Testicular hypofunction: Secondary | ICD-10-CM | POA: Diagnosis not present

## 2016-08-05 DIAGNOSIS — H903 Sensorineural hearing loss, bilateral: Secondary | ICD-10-CM | POA: Diagnosis not present

## 2016-09-08 DIAGNOSIS — Z23 Encounter for immunization: Secondary | ICD-10-CM | POA: Diagnosis not present

## 2016-09-08 DIAGNOSIS — M2550 Pain in unspecified joint: Secondary | ICD-10-CM | POA: Diagnosis not present

## 2017-01-21 DIAGNOSIS — J0100 Acute maxillary sinusitis, unspecified: Secondary | ICD-10-CM | POA: Diagnosis not present

## 2017-01-28 DIAGNOSIS — D1801 Hemangioma of skin and subcutaneous tissue: Secondary | ICD-10-CM | POA: Diagnosis not present

## 2017-01-28 DIAGNOSIS — D225 Melanocytic nevi of trunk: Secondary | ICD-10-CM | POA: Diagnosis not present

## 2017-01-28 DIAGNOSIS — L821 Other seborrheic keratosis: Secondary | ICD-10-CM | POA: Diagnosis not present

## 2017-01-28 DIAGNOSIS — L820 Inflamed seborrheic keratosis: Secondary | ICD-10-CM | POA: Diagnosis not present

## 2017-03-04 ENCOUNTER — Other Ambulatory Visit: Payer: Self-pay | Admitting: Allergy and Immunology

## 2017-03-04 ENCOUNTER — Ambulatory Visit
Admission: RE | Admit: 2017-03-04 | Discharge: 2017-03-04 | Disposition: A | Discharge status: Home / Self Care | Payer: Medicare Other | Source: Ambulatory Visit | Attending: Allergy and Immunology | Admitting: Allergy and Immunology

## 2017-03-04 DIAGNOSIS — J449 Chronic obstructive pulmonary disease, unspecified: Secondary | ICD-10-CM

## 2017-03-04 DIAGNOSIS — R0602 Shortness of breath: Secondary | ICD-10-CM | POA: Diagnosis not present

## 2017-03-04 DIAGNOSIS — J300 Vasomotor rhinitis: Secondary | ICD-10-CM | POA: Diagnosis not present

## 2017-03-23 DIAGNOSIS — D649 Anemia, unspecified: Secondary | ICD-10-CM | POA: Diagnosis not present

## 2017-03-23 DIAGNOSIS — R5381 Other malaise: Secondary | ICD-10-CM | POA: Diagnosis not present

## 2017-03-23 DIAGNOSIS — R0602 Shortness of breath: Secondary | ICD-10-CM | POA: Diagnosis not present

## 2017-03-23 DIAGNOSIS — E7800 Pure hypercholesterolemia, unspecified: Secondary | ICD-10-CM | POA: Diagnosis not present

## 2017-03-23 DIAGNOSIS — I10 Essential (primary) hypertension: Secondary | ICD-10-CM | POA: Diagnosis not present

## 2017-03-23 DIAGNOSIS — J449 Chronic obstructive pulmonary disease, unspecified: Secondary | ICD-10-CM | POA: Diagnosis not present

## 2017-03-23 DIAGNOSIS — E039 Hypothyroidism, unspecified: Secondary | ICD-10-CM | POA: Diagnosis not present

## 2017-03-24 ENCOUNTER — Telehealth: Payer: Self-pay

## 2017-03-24 NOTE — Telephone Encounter (Signed)
SENT NOTES TO SCHEDULING 

## 2017-03-25 DIAGNOSIS — R5383 Other fatigue: Secondary | ICD-10-CM | POA: Insufficient documentation

## 2017-03-25 DIAGNOSIS — R0602 Shortness of breath: Secondary | ICD-10-CM | POA: Insufficient documentation

## 2017-03-25 NOTE — Progress Notes (Signed)
Cardiology Office Note    Date:  03/26/2017   ID:  Shane Cook, DOB [birth year], MRN [id number]  PCP:  Shane Crutch, MD  Cardiologist: Shane Grooms, MD   Chief Complaint  Patient presents with  . Shortness of Breath    History of Present Illness:  Shane Cook is a 76 y.o. male referred by  Shane Cook, M.D.for evaluation of exertional fatigue and dyspnea.  Shane Cook, husband of Shane Cook also a patient over the past 10 years, comes in for evaluation of a 12-18 month history of exertional dyspnea. He notices this mostly when trying to motor his grass which she says is very small. Some of before last he could mow front and back without resting. His endurance aggressively decreased last summer and he became concerned this spring when he had to take multiple rest periods just trying to cut his back ER. The exertion does not produce chest discomfort. He denies orthopnea, PND, and significant ankle edema. He has never had DVT or history of PE.  Prior smoker. Has a diagnosis of COPD on his chart. Stop smoking at age 79 with approximately 20 pack years.  Both he and his wife recall a remote episode of severe chest discomfort that led to an emergency room visit but he was not admitted to the hospital. This was greater than 6 years ago.  Past Medical History:  Diagnosis Date  . Allergic rhinitis   . Anemia   . Arthritis   . Asthma    no problems recently  . Colon polyp   . Decreased hearing   . Dupuytren contracture   . ED (erectile dysfunction)   . Edema    takes hydrochlorothiazide  . Heart murmur    hx yrs ago  . Hypercholesterolemia   . Hypertension   . Low testosterone   . Nephritis    76 yrs old  hospitalized. no problem since  . Peyronie's disease 06/2004  . Polymyalgia rheumatica (HCC)     Past Surgical History:  Procedure Laterality Date  . ACHILLES TENDON REPAIR Right   . CATARACT EXTRACTION     bilateral  . HERNIA REPAIR    . KNEE ARTHROSCOPY     left   . MOUTH SURGERY     cyst 76 yrs old ?ethmoid  . peyronies  08?   penis bent/  . ROTATOR CUFF REPAIR     bilat 20 years apart  . TONSILLECTOMY    . TOTAL KNEE ARTHROPLASTY Left 08/30/2013   Dr Shane Cook  . TOTAL KNEE ARTHROPLASTY Left 08/30/2013   Procedure: TOTAL KNEE ARTHROPLASTY;  Surgeon: Shane Dibble, MD;  Location: [facility];  Service: Orthopedics;  Laterality: Left;    Current Medications: Outpatient Medications Prior to Visit  Medication Sig Dispense Refill  . acetaminophen (TYLENOL) 325 MG tablet Take 650 mg by mouth every 4 (four) hours as needed (fatigue).     Marland Kitchen albuterol (PROVENTIL HFA;VENTOLIN HFA) 108 (90 BASE) MCG/ACT inhaler Inhale 2 puffs into the lungs every 4 (four) hours as needed for shortness of breath.     . beta carotene w/minerals (OCUVITE) tablet Take 1 tablet by mouth daily.    . cetirizine (KLS ALLER-TEC) 10 MG tablet Take 10 mg by mouth daily.    . fluticasone (FLONASE) 50 MCG/ACT nasal spray Place 2 sprays into the nose daily as needed for rhinitis or allergies.     . hydrochlorothiazide (HYDRODIURIL) 25 MG tablet Take 25 mg by mouth  daily.    . aspirin EC 325 MG EC tablet Take 1 tablet (325 mg total) by mouth 2 (two) times daily. (Patient not taking: Reported on 11/12/2014) 30 tablet 0  . atorvastatin (LIPITOR) 40 MG tablet Take 40 mg by mouth daily.    Marland Kitchen HYDROcodone-acetaminophen (NORCO) 7.5-325 MG per tablet Take 1-2 tablets by mouth every 4 (four) hours as needed. (Patient not taking: Reported on 03/26/2017) 60 tablet 0  . methocarbamol (ROBAXIN) 500 MG tablet Take 1 tablet (500 mg total) by mouth every 6 (six) hours as needed. (Patient not taking: Reported on 03/26/2017) 30 tablet 1  . Misc Natural Products (PROSTATE HEALTH PO) Take 1 capsule by mouth daily.    . sildenafil (VIAGRA) 100 MG tablet Take 100 mg by mouth daily as needed for erectile dysfunction (erectile dysfunction).     Marland Kitchen testosterone cypionate (DEPOTESTOTERONE CYPIONATE) 200 MG/ML injection  Inject 100 mg into the muscle once a week. Takes on Saturdays     No facility-administered medications prior to visit.      Allergies:   Sulfa antibiotics and Amoxicillin   Social History   Social History  . Marital status: Married    Spouse name: N/A  . Number of children: N/A  . Years of education: N/A   Social History Main Topics  . Smoking status: Former Smoker    Packs/day: 2.00    Years: 21.00    Types: Cigarettes    Quit date: 05/28/1975  . Smokeless tobacco: Never Used  . Alcohol use No  . Drug use: No  . Sexual activity: Not Asked     Comment: MARRIED   Other Topics Concern  . None   Social History Narrative  . None     Family History:  The patient's family history includes Alzheimer's disease in his mother; Emphysema in his father; Liver cancer in his father; Lung cancer in his father.   ROS:   Please see the history of present illness.    Muscle pain, snoring, wheezing, excessive fatigue, Shane Cook's difficulty, and joint swelling.  All other systems reviewed and are negative.   PHYSICAL EXAM:   VS:  BP 116/76 (BP Location: Left Arm)   Pulse 75   Ht 5\' 10"  (1.778 m)   Wt 201 lb 9.6 oz (91.4 kg)   BMI 28.93 kg/m    GEN: Well nourished, well developed, in no acute distress  HEENT: normal  Neck: no JVD, carotid bruits, or masses Cardiac: RRR; no murmurs, rubs, or gallops,no edema  Respiratory:  clear to auscultation bilaterally, normal work of breathing GI: soft, nontender, nondistended, + BS MS: no deformity or atrophy  Skin: warm and dry, no rash Neuro:  Alert and Oriented x 3, Strength and sensation are intact Psych: euthymic mood, full affect  Wt Readings from Last 3 Encounters:  03/26/17 201 lb 9.6 oz (91.4 kg)  08/23/13 182 lb (82.6 kg)  09/03/12 200 lb (90.7 kg)      Studies/Labs Reviewed:   EKG:  EKG  Normal sinus rhythm, left axis deviation, no evidence of prior infarction. Normal conduction.  Recent Labs: No results found for  requested labs within last 8760 hours.   Lipid Panel No results found for: CHOL, TRIG, HDL, CHOLHDL, VLDL, LDLCALC, LDLDIRECT  Additional studies/ records that were reviewed today include:  Has seen an allergist in the past. He has had pulmonary function test. Problem list from [facility] carries a diagnosis of COPD.    ASSESSMENT:    1. Exertional  dyspnea   2. SOB (shortness of breath)      PLAN:  In order of problems listed above:  1. Exertional dyspnea of uncertain etiology. Rule out coronary artery disease versus either diastolic or systolic heart failure. No clinical evidence of either on exam and by current data. Dyspnea could be related to his pulmonary problem. We will get a stress myocardial perfusion study to rule out CAD. We'll also do an echocardiogram to assess LV function and right heart size and function.    Medication Adjustments/Labs and Tests Ordered: Current medicines are reviewed at length with the patient today.  Concerns regarding medicines are outlined above.  Medication changes, Labs and Tests ordered today are listed in the Patient Instructions below. Patient Instructions  Medication Instructions:  None  Labwork: None  Testing/Procedures: Your physician has requested that you have en exercise stress myoview. For further information please visit [email or web address]. Please follow instruction sheet, as given.  Your physician has requested that you have an echocardiogram. Echocardiography is a painless test that uses sound waves to create images of your heart. It provides your doctor with information about the size and shape of your heart and how well your heart's chambers and valves are working. This procedure takes approximately one hour. There are no restrictions for this procedure.   Follow-Up: Your physician recommends that you schedule a follow-up appointment as needed with Dr. Tamala Julian.    Any Other Special Instructions Will Be Listed Below (If  Applicable).     If you need a refill on your cardiac medications before your next appointment, please call your pharmacy.      Signed, Shane Grooms, MD  03/26/2017 4:46 PM    [facility] [address], [place], [place]  [address] Phone: [phone]; Fax: [phone]

## 2017-03-26 ENCOUNTER — Ambulatory Visit (INDEPENDENT_AMBULATORY_CARE_PROVIDER_SITE_OTHER): Payer: Medicare Other | Admitting: Interventional Cardiology

## 2017-03-26 ENCOUNTER — Encounter: Payer: Self-pay | Admitting: Interventional Cardiology

## 2017-03-26 VITALS — BP 116/76 | HR 75 | Ht 70.0 in | Wt 201.6 lb

## 2017-03-26 DIAGNOSIS — R0609 Other forms of dyspnea: Secondary | ICD-10-CM

## 2017-03-26 DIAGNOSIS — R0602 Shortness of breath: Secondary | ICD-10-CM

## 2017-03-26 NOTE — Patient Instructions (Signed)
Medication Instructions:  None  Labwork: None  Testing/Procedures: Your physician has requested that you have en exercise stress myoview. For further information please visit [email or web address]. Please follow instruction sheet, as given.  Your physician has requested that you have an echocardiogram. Echocardiography is a painless test that uses sound waves to create images of your heart. It provides your doctor with information about the size and shape of your heart and how well your heart's chambers and valves are working. This procedure takes approximately one hour. There are no restrictions for this procedure.   Follow-Up: Your physician recommends that you schedule a follow-up appointment as needed with Dr. Tamala Julian.    Any Other Special Instructions Will Be Listed Below (If Applicable).     If you need a refill on your cardiac medications before your next appointment, please call your pharmacy.

## 2017-04-06 ENCOUNTER — Telehealth (HOSPITAL_COMMUNITY): Payer: Self-pay | Admitting: *Deleted

## 2017-04-06 NOTE — Telephone Encounter (Signed)
Left message on voicemail per DPR in reference to upcoming appointment scheduled on  04/09/17 with detailed instructions given per Myocardial Perfusion Study Information Sheet for the test. LM to arrive 15 minutes early, and that it is imperative to arrive on time for appointment to keep from having the test rescheduled. If you need to cancel or reschedule your appointment, please call the office within 24 hours of your appointment. Failure to do so may result in a cancellation of your appointment, and a $50 no show fee. Phone number given for call back for any questions. Kirstie Peri

## 2017-04-09 ENCOUNTER — Ambulatory Visit (HOSPITAL_BASED_OUTPATIENT_CLINIC_OR_DEPARTMENT_OTHER): Payer: Medicare Other

## 2017-04-09 ENCOUNTER — Ambulatory Visit (HOSPITAL_COMMUNITY): Payer: Medicare Other | Attending: Cardiovascular Disease

## 2017-04-09 DIAGNOSIS — R0602 Shortness of breath: Secondary | ICD-10-CM | POA: Diagnosis not present

## 2017-04-09 DIAGNOSIS — R0609 Other forms of dyspnea: Secondary | ICD-10-CM | POA: Diagnosis not present

## 2017-04-09 LAB — MYOCARDIAL PERFUSION IMAGING
CHL CUP MPHR: 144 {beats}/min
CSEPEDS: 0 s
Estimated workload: 7 METS
Exercise duration (min): 6 min
LHR: 0.33
LV dias vol: 80 mL (ref 62–150)
LV sys vol: 22 mL
NUC STRESS TID: 0.72
Peak HR: 141 {beats}/min
Percent HR: 97 %
Rest HR: 59 {beats}/min
SDS: 0
SRS: 11
SSS: 11

## 2017-04-09 LAB — ECHOCARDIOGRAM COMPLETE
HEIGHTINCHES: 70 in
WEIGHTICAEL: 3216 [oz_av]

## 2017-04-09 MED ORDER — TECHNETIUM TC 99M TETROFOSMIN IV KIT
10.7000 | PACK | Freq: Once | INTRAVENOUS | Status: AC | PRN
  Administered 2017-04-09: 10.7 via INTRAVENOUS
  Filled 2017-04-09: qty 11

## 2017-04-09 MED ORDER — TECHNETIUM TC 99M TETROFOSMIN IV KIT
33.0000 | PACK | Freq: Once | INTRAVENOUS | Status: AC
  Administered 2017-04-09: 33 via INTRAVENOUS
  Filled 2017-04-09: qty 33

## 2017-04-13 ENCOUNTER — Encounter: Payer: Self-pay | Admitting: *Deleted

## 2017-04-15 ENCOUNTER — Other Ambulatory Visit: Payer: Self-pay | Admitting: Interventional Cardiology

## 2017-04-15 DIAGNOSIS — R9439 Abnormal result of other cardiovascular function study: Secondary | ICD-10-CM

## 2017-04-22 NOTE — H&P (Signed)
History of Present Illness:  Shane Cook is a 76 y.o. male referred by  Shane Cook, M.D.for evaluation of exertional fatigue and dyspnea.   Shane Cook, husband of Shane Cook also a patient over the past 10 years, comes in for evaluation of a 12-18 month history of exertional dyspnea. He notices this mostly when trying to motor his grass which she says is very small. Some of before last he could mow front and back without resting. His endurance aggressively decreased last summer and he became concerned this spring when he had to take multiple rest periods just trying to cut his back ER. The exertion does not produce chest discomfort. He denies orthopnea, PND, and significant ankle edema. He has never had DVT or history of PE.   Prior smoker. Has a diagnosis of COPD on his chart. Stop smoking at age 67 with approximately 20 pack years.   Both he and his wife recall a remote episode of severe chest discomfort that led to an emergency room visit but he was not admitted to the hospital. This was greater than 6 years ago.  After initial office visit, he underwent nuclear scintigraphy which was abnormal(see below) and resulted in recommendation to proceed with coronary angiography.       Past Medical History:  Diagnosis Date  . Allergic rhinitis    . Anemia    . Arthritis    . Asthma      no problems recently  . Colon polyp    . Decreased hearing    . Dupuytren contracture    . ED (erectile dysfunction)    . Edema      takes hydrochlorothiazide  . Heart murmur      hx yrs ago  . Hypercholesterolemia    . Hypertension    . Low testosterone    . Nephritis      76 yrs old  hospitalized. no problem since  . Peyronie's disease 06/2004  . Polymyalgia rheumatica (HCC)             Past Surgical History:  Procedure Laterality Date  . ACHILLES TENDON REPAIR Right    . CATARACT EXTRACTION        bilateral  . HERNIA REPAIR      . KNEE ARTHROSCOPY        left  . MOUTH SURGERY         cyst 76 yrs old ?ethmoid  . peyronies   08?    penis bent/  . ROTATOR CUFF REPAIR        bilat 20 years apart  . TONSILLECTOMY      . TOTAL KNEE ARTHROPLASTY Left 08/30/2013    Dr Rhona Raider  . TOTAL KNEE ARTHROPLASTY Left 08/30/2013    Procedure: TOTAL KNEE ARTHROPLASTY;  Surgeon: Hessie Dibble, MD;  Location: [facility];  Service: Orthopedics;  Laterality: Left;      Current Medications:       Outpatient Medications Prior to Visit  Medication Sig Dispense Refill  . acetaminophen (TYLENOL) 325 MG tablet Take 650 mg by mouth every 4 (four) hours as needed (fatigue).       Marland Kitchen albuterol (PROVENTIL HFA;VENTOLIN HFA) 108 (90 BASE) MCG/ACT inhaler Inhale 2 puffs into the lungs every 4 (four) hours as needed for shortness of breath.       . beta carotene w/minerals (OCUVITE) tablet Take 1 tablet by mouth daily.      . cetirizine (KLS ALLER-TEC) 10 MG  tablet Take 10 mg by mouth daily.      . fluticasone (FLONASE) 50 MCG/ACT nasal spray Place 2 sprays into the nose daily as needed for rhinitis or allergies.       . hydrochlorothiazide (HYDRODIURIL) 25 MG tablet Take 25 mg by mouth daily.      Marland Kitchen aspirin EC 325 MG EC tablet Take 1 tablet (325 mg total) by mouth 2 (two) times daily. (Patient not taking: Reported on 11/12/2014) 30 tablet 0  . atorvastatin (LIPITOR) 40 MG tablet Take 40 mg by mouth daily.      Marland Kitchen HYDROcodone-acetaminophen (NORCO) 7.5-325 MG per tablet Take 1-2 tablets by mouth every 4 (four) hours as needed. (Patient not taking: Reported on 03/26/2017) 60 tablet 0  . methocarbamol (ROBAXIN) 500 MG tablet Take 1 tablet (500 mg total) by mouth every 6 (six) hours as needed. (Patient not taking: Reported on 03/26/2017) 30 tablet 1  . Misc Natural Products (PROSTATE HEALTH PO) Take 1 capsule by mouth daily.      . sildenafil (VIAGRA) 100 MG tablet Take 100 mg by mouth daily as needed for erectile dysfunction (erectile dysfunction).       Marland Kitchen testosterone cypionate (DEPOTESTOTERONE CYPIONATE) 200  MG/ML injection Inject 100 mg into the muscle once a week. Takes on Saturdays        No facility-administered medications prior to visit.       Allergies:   Sulfa antibiotics and Amoxicillin    Social History         Social History  . Marital status: Married      Spouse name: N/A  . Number of children: N/A  . Years of education: N/A          Social History Main Topics  . Smoking status: Former Smoker      Packs/day: 2.00      Years: 21.00      Types: Cigarettes      Quit date: 05/28/1975  . Smokeless tobacco: Never Used  . Alcohol use No  . Drug use: No  . Sexual activity: Not Asked        Comment: MARRIED        Other Topics Concern  . None       Social History Narrative  . None      Family History:  The patient's family history includes Alzheimer's disease in his mother; Emphysema in his father; Liver cancer in his father; Lung cancer in his father.    ROS:   Please see the history of present illness.    Muscle pain, snoring, wheezing, excessive fatigue, Shane Cook's difficulty, and joint swelling.  All other systems reviewed and are negative.     PHYSICAL EXAM:   VS:  BP 116/76 (BP Location: Left Arm)   Pulse 75   Ht 5\' 10"  (1.778 m)   Wt 201 lb 9.6 oz (91.4 kg)   BMI 28.93 kg/m    GEN: Well nourished, well developed, in no acute distress  HEENT: normal  Neck: no JVD, carotid bruits, or masses Cardiac: RRR; no murmurs, rubs, or gallops,no edema  Respiratory:  clear to auscultation bilaterally, normal work of breathing GI: soft, nontender, nondistended, + BS MS: no deformity or atrophy  Skin: warm and dry, no rash Neuro:  Alert and Oriented x 3, Strength and sensation are intact Psych: euthymic mood, full affect      Wt Readings from Last 3 Encounters:  03/26/17 201 lb 9.6 oz (91.4 kg)  08/23/13 182  lb (82.6 kg)  09/03/12 200 lb (90.7 kg)        Studies/Labs Reviewed:    EKG:  EKG  Normal sinus rhythm, left axis deviation, no evidence of prior  infarction. Normal conduction.   Recent Labs:  Nuclear stress test 04/09/2017:  Study Highlights     Nuclear stress EF: 72%.  Blood pressure demonstrated a normal response to exercise.  There was no ST segment deviation noted during stress.  Defect 1: There is a medium defect of mild severity present in the basal inferior, basal inferolateral, mid inferior, mid inferolateral and apical inferior location.  Findings consistent with ischemia.  This is an intermediate risk study.  The left ventricular ejection fraction is normal (55-65%).   Findings are consistent with ischemia in the LCX territory. Normal LVEF.        Lipid Panel Labs (Brief)  No results found for: CHOL, TRIG, HDL, CHOLHDL, VLDL, LDLCALC, LDLDIRECT     Additional studies/ records that were reviewed today include:  Has seen an allergist in the past. He has had pulmonary function test. [facility] list from [facility] carries a diagnosis of COPD.       ASSESSMENT:     1. Exertional dyspnea      2.   Abnormal nuclear stress test - intermediate risk     PLAN:  In order of problems listed above:   1. The patient was counseled to undergo left heart catheterization, coronary angiography, and possible percutaneous coronary intervention with stent implantation. The procedural risks and benefits were discussed in detail. The risks discussed included death, stroke, myocardial infarction, life-threatening bleeding, limb ischemia, kidney injury, allergy, and possible emergency cardiac surgery. The risk of these significant complications were estimated to occur less than 1% of the time. After discussion, the patient has agreed to proceed.

## 2017-04-27 ENCOUNTER — Other Ambulatory Visit: Payer: Medicare Other | Admitting: *Deleted

## 2017-04-27 ENCOUNTER — Other Ambulatory Visit: Payer: Self-pay

## 2017-04-27 DIAGNOSIS — I2510 Atherosclerotic heart disease of native coronary artery without angina pectoris: Secondary | ICD-10-CM

## 2017-04-27 NOTE — Progress Notes (Signed)
Patient needs lab work orders for heart cath that is scheduled for next week. Orders are in and patient is having blood drawn today.

## 2017-04-28 DIAGNOSIS — E039 Hypothyroidism, unspecified: Secondary | ICD-10-CM | POA: Diagnosis not present

## 2017-04-28 DIAGNOSIS — I10 Essential (primary) hypertension: Secondary | ICD-10-CM | POA: Diagnosis not present

## 2017-04-28 DIAGNOSIS — E7800 Pure hypercholesterolemia, unspecified: Secondary | ICD-10-CM | POA: Diagnosis not present

## 2017-04-28 DIAGNOSIS — Z0000 Encounter for general adult medical examination without abnormal findings: Secondary | ICD-10-CM | POA: Diagnosis not present

## 2017-04-28 DIAGNOSIS — J449 Chronic obstructive pulmonary disease, unspecified: Secondary | ICD-10-CM | POA: Diagnosis not present

## 2017-04-28 LAB — BASIC METABOLIC PANEL
BUN / CREAT RATIO: 12 (ref 10–24)
BUN: 15 mg/dL (ref 8–27)
CALCIUM: 9.3 mg/dL (ref 8.6–10.2)
CO2: 27 mmol/L (ref 18–29)
Chloride: 103 mmol/L (ref 96–106)
Creatinine, Ser: 1.26 mg/dL (ref 0.76–1.27)
GFR calc Af Amer: 63 mL/min/{1.73_m2} (ref >59)
GFR, EST NON AFRICAN AMERICAN: 55 mL/min/{1.73_m2} — AB (ref >59)
Glucose: 101 mg/dL — ABNORMAL HIGH (ref 65–99)
POTASSIUM: 4.3 mmol/L (ref 3.5–5.2)
SODIUM: 146 mmol/L — AB (ref 134–144)

## 2017-04-28 LAB — CBC WITH DIFFERENTIAL/PLATELET
BASOS ABS: 0 10*3/uL (ref 0.0–0.2)
BASOS: 0 %
EOS (ABSOLUTE): 0.2 10*3/uL (ref 0.0–0.4)
Eos: 3 %
HEMATOCRIT: 40.8 % (ref 37.5–51.0)
Hemoglobin: 13.5 g/dL (ref 13.0–17.7)
Immature Grans (Abs): 0 10*3/uL (ref 0.0–0.1)
Immature Granulocytes: 1 %
LYMPHS ABS: 1.5 10*3/uL (ref 0.7–3.1)
LYMPHS: 22 %
MCH: 30.3 pg (ref 26.6–33.0)
MCHC: 33.1 g/dL (ref 31.5–35.7)
MCV: 92 fL (ref 79–97)
MONOS ABS: 0.7 10*3/uL (ref 0.1–0.9)
Monocytes: 11 %
Neutrophils Absolute: 4.2 10*3/uL (ref 1.4–7.0)
Neutrophils: 63 %
Platelets: 231 10*3/uL (ref 150–379)
RBC: 4.46 x10E6/uL (ref 4.14–5.80)
RDW: 14.7 % (ref 12.3–15.4)
WBC: 6.7 10*3/uL (ref 3.4–10.8)

## 2017-04-28 LAB — PROTIME-INR
INR: 1.1 (ref 0.8–1.2)
Prothrombin Time: 11.2 s (ref 9.1–12.0)

## 2017-05-01 ENCOUNTER — Telehealth: Payer: Self-pay

## 2017-05-01 NOTE — Telephone Encounter (Signed)
Call placed to Pt to verify cath instructions.  Call went to VM.  Left message requesting call back.  This nurse name and # left.  Will await return call.

## 2017-05-02 DIAGNOSIS — R9439 Abnormal result of other cardiovascular function study: Secondary | ICD-10-CM

## 2017-05-04 ENCOUNTER — Encounter (HOSPITAL_COMMUNITY): Payer: Self-pay | Admitting: Interventional Cardiology

## 2017-05-04 ENCOUNTER — Ambulatory Visit (HOSPITAL_COMMUNITY)
Admission: RE | Admit: 2017-05-04 | Discharge: 2017-05-04 | Disposition: A | Discharge status: Home / Self Care | Payer: Medicare Other | Source: Ambulatory Visit | Attending: Interventional Cardiology | Admitting: Interventional Cardiology

## 2017-05-04 ENCOUNTER — Other Ambulatory Visit: Payer: Self-pay | Admitting: *Deleted

## 2017-05-04 ENCOUNTER — Encounter (HOSPITAL_COMMUNITY)
Admission: RE | Disposition: A | Discharge status: Home / Self Care | Payer: Self-pay | Source: Ambulatory Visit | Attending: Interventional Cardiology

## 2017-05-04 DIAGNOSIS — J449 Chronic obstructive pulmonary disease, unspecified: Secondary | ICD-10-CM | POA: Insufficient documentation

## 2017-05-04 DIAGNOSIS — R0609 Other forms of dyspnea: Secondary | ICD-10-CM | POA: Insufficient documentation

## 2017-05-04 DIAGNOSIS — Z7951 Long term (current) use of inhaled steroids: Secondary | ICD-10-CM | POA: Insufficient documentation

## 2017-05-04 DIAGNOSIS — I10 Essential (primary) hypertension: Secondary | ICD-10-CM | POA: Diagnosis not present

## 2017-05-04 DIAGNOSIS — Z882 Allergy status to sulfonamides status: Secondary | ICD-10-CM | POA: Insufficient documentation

## 2017-05-04 DIAGNOSIS — R400 Somnolence: Secondary | ICD-10-CM

## 2017-05-04 DIAGNOSIS — M1990 Unspecified osteoarthritis, unspecified site: Secondary | ICD-10-CM | POA: Diagnosis not present

## 2017-05-04 DIAGNOSIS — M353 Polymyalgia rheumatica: Secondary | ICD-10-CM | POA: Diagnosis not present

## 2017-05-04 DIAGNOSIS — Z880 Allergy status to penicillin: Secondary | ICD-10-CM | POA: Insufficient documentation

## 2017-05-04 DIAGNOSIS — R9439 Abnormal result of other cardiovascular function study: Secondary | ICD-10-CM

## 2017-05-04 DIAGNOSIS — R0602 Shortness of breath: Secondary | ICD-10-CM | POA: Diagnosis present

## 2017-05-04 DIAGNOSIS — E7800 Pure hypercholesterolemia, unspecified: Secondary | ICD-10-CM | POA: Insufficient documentation

## 2017-05-04 DIAGNOSIS — Z7982 Long term (current) use of aspirin: Secondary | ICD-10-CM | POA: Insufficient documentation

## 2017-05-04 DIAGNOSIS — Z87891 Personal history of nicotine dependence: Secondary | ICD-10-CM | POA: Insufficient documentation

## 2017-05-04 DIAGNOSIS — R0683 Snoring: Secondary | ICD-10-CM

## 2017-05-04 HISTORY — PX: LEFT HEART CATH AND CORONARY ANGIOGRAPHY: CATH118249

## 2017-05-04 SURGERY — LEFT HEART CATH AND CORONARY ANGIOGRAPHY
Anesthesia: LOCAL

## 2017-05-04 MED ORDER — FENTANYL CITRATE (PF) 100 MCG/2ML IJ SOLN
INTRAMUSCULAR | Status: AC
  Filled 2017-05-04: qty 2

## 2017-05-04 MED ORDER — SODIUM CHLORIDE 0.9 % IV SOLN: 250.0000 mL | INTRAVENOUS | Status: DC | PRN

## 2017-05-04 MED ORDER — LIDOCAINE HCL 1 % IJ SOLN
INTRAMUSCULAR | Status: AC
  Filled 2017-05-04: qty 20

## 2017-05-04 MED ORDER — VERAPAMIL HCL 2.5 MG/ML IV SOLN
INTRAVENOUS | Status: DC | PRN
  Administered 2017-05-04: 10 mL via INTRA_ARTERIAL

## 2017-05-04 MED ORDER — MIDAZOLAM HCL 2 MG/2ML IJ SOLN
INTRAMUSCULAR | Status: DC | PRN
  Administered 2017-05-04: 1 mg via INTRAVENOUS

## 2017-05-04 MED ORDER — SODIUM CHLORIDE 0.9 % WEIGHT BASED INFUSION
3.0000 mL/kg/h | INTRAVENOUS | Status: AC
  Administered 2017-05-04: 3 mL/kg/h via INTRAVENOUS

## 2017-05-04 MED ORDER — VERAPAMIL HCL 2.5 MG/ML IV SOLN
INTRAVENOUS | Status: AC
Start: 2017-05-04 — End: 2017-05-04
  Filled 2017-05-04: qty 2

## 2017-05-04 MED ORDER — HEPARIN (PORCINE) IN NACL 2-0.9 UNIT/ML-% IJ SOLN
INTRAMUSCULAR | Status: AC
  Filled 2017-05-04: qty 500

## 2017-05-04 MED ORDER — IOPAMIDOL (ISOVUE-370) INJECTION 76%
INTRAVENOUS | Status: AC
  Filled 2017-05-04: qty 100

## 2017-05-04 MED ORDER — ASPIRIN 81 MG PO CHEW
324.0000 mg | CHEWABLE_TABLET | ORAL | Status: AC
  Administered 2017-05-04: 324 mg via ORAL

## 2017-05-04 MED ORDER — IOPAMIDOL (ISOVUE-370) INJECTION 76%
INTRAVENOUS | Status: DC | PRN
  Administered 2017-05-04: 85 mL via INTRA_ARTERIAL

## 2017-05-04 MED ORDER — ONDANSETRON HCL 4 MG/2ML IJ SOLN: 4.0000 mg | Freq: Four times a day (QID) | INTRAMUSCULAR | Status: DC | PRN

## 2017-05-04 MED ORDER — ASPIRIN 81 MG PO CHEW
CHEWABLE_TABLET | ORAL | Status: AC
  Administered 2017-05-04: 324 mg via ORAL
  Filled 2017-05-04: qty 4

## 2017-05-04 MED ORDER — SODIUM CHLORIDE 0.9 % IV SOLN: INTRAVENOUS | Status: AC

## 2017-05-04 MED ORDER — SODIUM CHLORIDE 0.9% FLUSH: 3.0000 mL | INTRAVENOUS | Status: DC | PRN

## 2017-05-04 MED ORDER — SODIUM CHLORIDE 0.9 % WEIGHT BASED INFUSION
1.0000 mL/kg/h | INTRAVENOUS | Status: DC
Start: 2017-05-04 — End: 2017-05-04

## 2017-05-04 MED ORDER — FENTANYL CITRATE (PF) 100 MCG/2ML IJ SOLN
INTRAMUSCULAR | Status: DC | PRN
  Administered 2017-05-04: 50 ug via INTRAVENOUS

## 2017-05-04 MED ORDER — HEPARIN SODIUM (PORCINE) 1000 UNIT/ML IJ SOLN
INTRAMUSCULAR | Status: DC | PRN
  Administered 2017-05-04: 5000 [IU] via INTRAVENOUS

## 2017-05-04 MED ORDER — HEPARIN (PORCINE) IN NACL 2-0.9 UNIT/ML-% IJ SOLN
INTRAMUSCULAR | Status: AC | PRN
  Administered 2017-05-04: 1000 mL

## 2017-05-04 MED ORDER — MIDAZOLAM HCL 2 MG/2ML IJ SOLN
INTRAMUSCULAR | Status: AC
  Filled 2017-05-04: qty 2

## 2017-05-04 MED ORDER — HEPARIN SODIUM (PORCINE) 1000 UNIT/ML IJ SOLN
INTRAMUSCULAR | Status: AC
  Filled 2017-05-04: qty 1

## 2017-05-04 MED ORDER — SODIUM CHLORIDE 0.9% FLUSH: 3.0000 mL | Freq: Two times a day (BID) | INTRAVENOUS | Status: DC

## 2017-05-04 MED ORDER — ACETAMINOPHEN 325 MG PO TABS: 650.0000 mg | ORAL_TABLET | ORAL | Status: DC | PRN

## 2017-05-04 SURGICAL SUPPLY — 12 items
CATH INFINITI 5 FR JL3.5 (CATHETERS) ×2 IMPLANT
CATH INFINITI JR4 5F (CATHETERS) ×2 IMPLANT
COVER PRB 48X5XTLSCP FOLD TPE (BAG) ×1 IMPLANT
COVER PROBE 5X48 (BAG) ×1
DEVICE RAD COMP TR BAND LRG (VASCULAR PRODUCTS) ×2 IMPLANT
GLIDESHEATH SLEND A-KIT 6F 22G (SHEATH) ×2 IMPLANT
GUIDEWIRE INQWIRE 1.5J.035X260 (WIRE) ×1 IMPLANT
INQWIRE 1.5J .035X260CM (WIRE) ×2
KIT HEART LEFT (KITS) ×2 IMPLANT
PACK CARDIAC CATHETERIZATION (CUSTOM PROCEDURE TRAY) ×2 IMPLANT
TRANSDUCER W/STOPCOCK (MISCELLANEOUS) ×2 IMPLANT
TUBING CIL FLEX 10 FLL-RA (TUBING) ×2 IMPLANT

## 2017-05-04 NOTE — Progress Notes (Signed)
Per Dr. Tamala Julian- Order a sleep study for snoring and daytime sleepiness.

## 2017-05-04 NOTE — Interval H&P Note (Signed)
Cath Lab Visit (complete for each Cath Lab visit)  Clinical Evaluation Leading to the Procedure:   ACS: Yes.    Non-ACS:    Anginal Classification: CCS Cook  Anti-ischemic medical therapy: Maximal Therapy (2 or more classes of medications)  Non-Invasive Test Results:  Prior CABG: No previous CABG      History and Physical Interval Note:  05/04/2017 7:47 AM  [name]  has presented today for surgery, with the diagnosis of abnormal nuc - cad  The various methods of treatment have been discussed with the patient and family. After consideration of risks, benefits and other options for treatment, the patient has consented to  Procedure(s): Left Heart Cath and Coronary Angiography (N/A) as a surgical intervention .  The patient's history has been reviewed, patient examined, no change in status, stable for surgery.  I have reviewed the patient's chart and labs.  Questions were answered to the patient's satisfaction.     Shane Cook

## 2017-05-04 NOTE — Discharge Instructions (Signed)

## 2017-05-05 ENCOUNTER — Telehealth: Payer: Self-pay

## 2017-05-05 ENCOUNTER — Telehealth: Payer: Self-pay | Admitting: *Deleted

## 2017-05-05 NOTE — Telephone Encounter (Signed)
Informed patient of sleep study results and patient understanding was verbalized. Patient understands his sleep study is scheduled for July 03 2017. Patient understands his sleep study will be done at [facility] sleep lab. Patient understands he will receive a sleep packet in a week or so. Patient understands to call if he does not receive the sleep packet in a timely manner. Patient agrees with treatment and thanked me for call

## 2017-05-05 NOTE — Telephone Encounter (Signed)
Patient contacted regarding discharge from Shane Cook s/p catheterization 05/04/2017 Patient understands to follow up with provider: made appt to f/u with [name] 05/25/2017 @ 1030 am.  Pt could not come any sooner d/t trip to [place] Pt understands discharge instructions? yes Pt understands medication regimen? No changes made

## 2017-05-05 NOTE — Telephone Encounter (Signed)
-----   Message from Loren Racer, LPN sent at [phone]/[id number]/6681  5:19 PM EDT ----- Sleep study ordered per Dr. Tamala Julian.

## 2017-05-21 ENCOUNTER — Ambulatory Visit: Payer: Medicare Other | Admitting: Cardiology

## 2017-05-25 ENCOUNTER — Encounter: Payer: Self-pay | Admitting: Nurse Practitioner

## 2017-05-25 ENCOUNTER — Ambulatory Visit (INDEPENDENT_AMBULATORY_CARE_PROVIDER_SITE_OTHER): Payer: Medicare Other | Admitting: Nurse Practitioner

## 2017-05-25 VITALS — BP 128/72 | HR 68 | Ht 70.0 in | Wt 206.8 lb

## 2017-05-25 DIAGNOSIS — I2510 Atherosclerotic heart disease of native coronary artery without angina pectoris: Secondary | ICD-10-CM

## 2017-05-25 DIAGNOSIS — R0609 Other forms of dyspnea: Secondary | ICD-10-CM

## 2017-05-25 DIAGNOSIS — R0600 Dyspnea, unspecified: Secondary | ICD-10-CM

## 2017-05-25 NOTE — Progress Notes (Signed)
CARDIOLOGY OFFICE NOTE  Date:  05/25/2017    Shane Cook Date of Birth: 07/08/1941 Medical Record #[id number]  PCP:  Lawerance Cruel, MD  Cardiologist:  Jennings Books  Chief Complaint  Patient presents with  . Shortness of Breath    Post cath visit - seen for Dr. Tamala Julian    History of Present Illness: Shane Cook is a 76 y.o. male who presents today for a follow up visit. Seen for Dr. Tamala Julian.   Patient was recently referred here by Melinda Crutch, M.D.for evaluation of exertional fatigue and dyspnea over the past 12 to 18 months. Remote smoker. Other issues include COPD.   Comes in today. Here with his wife. He says he is still short of breath and "just gets tired" when he tries to exert himself. This has been going on for at least the last year or so.  No regular exercise. No chest pain. A year ago he could walk around the block - wife then noticed that she would get ahead of him and he could not keep up. That was progressive and now no longer walks. He cannot mow his yard now in one day - has to split up now over several days. As long as he sits "I'm fine". Some snoring. Wife has thought he might not be breathing at times during the night. Sleep study has been ordered. Negative CXR from April noted.   Past Medical History:  Diagnosis Date  . Allergic rhinitis   . Anemia   . Arthritis   . Asthma    no problems recently  . Colon polyp   . Decreased hearing   . Dupuytren contracture   . ED (erectile dysfunction)   . Edema    takes hydrochlorothiazide  . Heart murmur    hx yrs ago  . Hypercholesterolemia   . Hypertension   . Low testosterone   . Nephritis    76 yrs old  hospitalized. no problem since  . Peyronie's disease 06/2004  . Polymyalgia rheumatica (HCC)     Past Surgical History:  Procedure Laterality Date  . ACHILLES TENDON REPAIR Right   . CATARACT EXTRACTION     bilateral  . HERNIA REPAIR    . KNEE ARTHROSCOPY     left  . LEFT HEART CATH  AND CORONARY ANGIOGRAPHY N/A 05/04/2017   Procedure: Left Heart Cath and Coronary Angiography;  Surgeon: Belva Crome, MD;  Location: [facility];  Service: Cardiovascular;  Laterality: N/A;  . MOUTH SURGERY     cyst 76 yrs old ?ethmoid  . peyronies  08?   penis bent/  . ROTATOR CUFF REPAIR     bilat 20 years apart  . TONSILLECTOMY    . TOTAL KNEE ARTHROPLASTY Left 08/30/2013   Dr Rhona Raider  . TOTAL KNEE ARTHROPLASTY Left 08/30/2013   Procedure: TOTAL KNEE ARTHROPLASTY;  Surgeon: Hessie Dibble, MD;  Location: [facility];  Service: Orthopedics;  Laterality: Left;     Medications: Current Meds  Medication Sig  . acetaminophen (TYLENOL) 650 MG CR tablet Take 1,300 mg by mouth every 8 (eight) hours as needed for pain.  Marland Kitchen albuterol (PROVENTIL HFA;VENTOLIN HFA) 108 (90 BASE) MCG/ACT inhaler Inhale 2 puffs into the lungs every 4 (four) hours as needed for shortness of breath.   Marland Kitchen atorvastatin (LIPITOR) 40 MG tablet Take 20 mg by mouth daily.  . beta carotene w/minerals (OCUVITE) tablet Take 1 tablet by mouth daily.  Marland Kitchen  cetirizine (KLS ALLER-TEC) 10 MG tablet Take 10 mg by mouth daily.  . finasteride (PROSCAR) 5 MG tablet Take 5 mg by mouth daily.  . fluticasone (FLONASE) 50 MCG/ACT nasal spray Place 2 sprays into the nose daily as needed for rhinitis or allergies.   . hydrochlorothiazide (HYDRODIURIL) 25 MG tablet Take 12.5 mg by mouth daily.   . Hypertonic Nasal Wash (SINUS RINSE NA) Place 1 Dose into the nose daily as needed (congestion).  Marland Kitchen levothyroxine (SYNTHROID, LEVOTHROID) 125 MCG tablet Take 125 mcg by mouth daily.   . mometasone (NASONEX) 50 MCG/ACT nasal spray Place 2 sprays into the nose 2 (two) times daily as needed (allergies).  Marland Kitchen testosterone cypionate (DEPO-TESTOSTERONE) 200 MG/ML injection Inject 100 mg into the muscle every Thursday.     Allergies: Allergies  Allergen Reactions  . Sulfa Antibiotics     Questionable allergy  . Amoxicillin Rash    Has patient had a  PCN reaction causing immediate rash, facial/tongue/throat swelling, SOB or lightheadedness with hypotension: Yes Has patient had a PCN reaction causing severe rash involving mucus membranes or skin necrosis: No Has patient had a PCN reaction that required hospitalization: No Has patient had a PCN reaction occurring within the last 10 years: No If all of the above answers are "NO", then may proceed with Cephalosporin use.     Social History: The patient  reports that he quit smoking about 42 years ago. His smoking use included Cigarettes. He has a 42.00 pack-year smoking history. He has never used smokeless tobacco. He reports that he does not drink alcohol or use drugs.   Family History: The patient's family history includes Alzheimer's disease in his mother; Emphysema in his father; Liver cancer in his father; Lung cancer in his father.   Review of Systems: Please see the history of present illness.   Otherwise, the review of systems is positive for none.   All other systems are reviewed and negative.   Physical Exam: VS:  BP 128/72 (BP Location: Left Arm, Patient Position: Sitting, Cuff Size: Normal)   Pulse 68   Ht 5\' 10"  (1.778 m)   Wt 206 lb 12.8 oz (93.8 kg)   SpO2 94% Comment: at rest  BMI 29.67 kg/m  .  BMI Body mass index is 29.67 kg/m.  Wt Readings from Last 3 Encounters:  05/25/17 206 lb 12.8 oz (93.8 kg)  05/04/17 204 lb (92.5 kg)  04/09/17 201 lb (91.2 kg)    General: Pleasant. Well developed, well nourished and in no acute distress.  He is overweight.  HEENT: Normal.  Neck: Supple, no JVD, carotid bruits, or masses noted.  Cardiac: Regular rate and rhythm. No murmurs, rubs, or gallops. No edema.  Respiratory:  Lungs are clear to auscultation bilaterally with normal work of breathing.  GI: Soft and nontender.  MS: No deformity or atrophy. Gait and ROM intact.  Skin: Warm and dry. Color is normal.  Neuro:  Strength and sensation are intact and no gross focal  deficits noted.  Psych: Alert, appropriate and with normal affect.   LABORATORY DATA:  EKG:  EKG is not ordered today.  Lab Results  Component Value Date   WBC 6.7 04/27/2017   HGB 13.5 04/27/2017   HCT 40.8 04/27/2017   PLT 231 04/27/2017   GLUCOSE 101 (H) 04/27/2017   ALT 52 09/29/2007   AST 27 09/29/2007   NA 146 (H) 04/27/2017   K 4.3 04/27/2017   CL 103 04/27/2017   CREATININE 1.26  04/27/2017   BUN 15 04/27/2017   CO2 27 04/27/2017   INR 1.1 04/27/2017     BNP (last 3 results) No results for input(s): BNP in the last 8760 hours.  ProBNP (last 3 results) No results for input(s): PROBNP in the last 8760 hours.   Other Studies Reviewed Today:  Procedures   Left Heart Cath and Coronary Angiography 04/2017  Conclusion    Normal coronary arteries.  Normal left ventricular systolic function, EF 76%, with upper normal LVEDP of 18 mmHg. LVEDP suggests mild diastolic dysfunction.  False positive myocardial perfusion imaging.  RECOMMENDATIONS:   Essentially normal cardiac workup. There is potentially some contribution from the heart with reference to dyspnea although it is felt to be slight.  Fatigue and dyspnea are likely related to other issues. Could be metabolic or sleep apnea related. Consider pulmonary involvement.  No further cardiac workup is necessary and overall cardiovascular prognosis appears great.   Echo Study Conclusions May 2018  - Left ventricle: The cavity size was normal. Wall thickness was   increased in a pattern of mild LVH. Systolic function was normal.   Wall motion was normal; there were no regional wall motion   abnormalities. Left ventricular diastolic function parameters   were normal. - Atrial septum: No defect or patent foramen ovale was identified.   Myoview Study Highlights from 5/208     Nuclear stress EF: 72%.  Blood pressure demonstrated a normal response to exercise.  There was no ST segment deviation noted  during stress.  Defect 1: There is a medium defect of mild severity present in the basal inferior, basal inferolateral, mid inferior, mid inferolateral and apical inferior location.  Findings consistent with ischemia.  This is an intermediate risk study.  The left ventricular ejection fraction is normal (55-65%).   Findings are consistent with ischemia in the LCX territory. Normal LVEF.       Assessment/Plan:  1. Exertional dyspnea with abnormal Myoview - s/p cardiac cath - with normal coronaries - his Myoview was false +.  Would search for other issues for his dyspnea. Consider pulmonary referral and sleep study. BNP today. His sleep study has been ordered. He is interested in pulmonary referral. Long discussion about diet/weight/regular exercise today. He does seem motivated.   2. COPD with prior smoking history  Current medicines are reviewed with the patient today.  The patient does not have concerns regarding medicines other than what has been noted above.  The following changes have been made:  See above.  Labs/ tests ordered today include:    Orders Placed This Encounter  Procedures  . Pro b natriuretic peptide (BNP)  . Ambulatory referral to Pulmonology     Disposition:   FU with [place] prn.     Patient is agreeable to this plan and will call if any problems develop in the interim.   SignedTruitt Merle, NP  05/25/2017 11:20 AM  [facility] [address] [place], [place]  [address] Phone: [phone] Fax: [phone]

## 2017-05-25 NOTE — Patient Instructions (Addendum)
We will be checking the following labs today - BNP   Medication Instructions:    Continue with your current medicines.     Testing/Procedures To Be Arranged:  N/A  Follow-Up:   See Dr. Tamala Julian back as needed.   Referral to Dr. Ashok Cordia or [facility] with pulmonary.     Other Special Instructions:  Here are my tips to lose weight:  1. Drink only water. You do not need milk, juice, tea, soda or diet soda.  2. Do not eat anything "white". This includes white bread, potatoes, rice or mayo  3. Stay away from fried foods and sweets  4. Your portion should be the size of the palm of your hand.  5. Know what your weaknesses are and avoid.   6. Find an exercise you like and do it every day for 45 to 60 minutes.         If you need a refill on your cardiac medications before your next appointment, please call your pharmacy.   Call the [place] office at [phone] if you have any questions, problems or concerns.

## 2017-05-26 LAB — PRO B NATRIURETIC PEPTIDE: NT-Pro BNP: 25 pg/mL (ref 0–486)

## 2017-06-03 ENCOUNTER — Ambulatory Visit (INDEPENDENT_AMBULATORY_CARE_PROVIDER_SITE_OTHER): Payer: Medicare Other | Admitting: Pulmonary Disease

## 2017-06-03 ENCOUNTER — Encounter: Payer: Self-pay | Admitting: Pulmonary Disease

## 2017-06-03 VITALS — BP 128/64 | HR 93 | Ht 70.0 in | Wt 198.2 lb

## 2017-06-03 DIAGNOSIS — R0609 Other forms of dyspnea: Secondary | ICD-10-CM | POA: Diagnosis not present

## 2017-06-03 DIAGNOSIS — M1990 Unspecified osteoarthritis, unspecified site: Secondary | ICD-10-CM

## 2017-06-03 DIAGNOSIS — I2510 Atherosclerotic heart disease of native coronary artery without angina pectoris: Secondary | ICD-10-CM | POA: Diagnosis not present

## 2017-06-03 DIAGNOSIS — R0683 Snoring: Secondary | ICD-10-CM

## 2017-06-03 NOTE — Patient Instructions (Signed)
   We will review your test results at your next appointment.  We will be setting up appointments for a walking test and a breathing test.  Call or e-mail me if you have any new breathing problems or questions before your next appointment.  TESTS ORDERED: 1. Full PFTs before next appointment 2. 6MWT on room air before next appointment

## 2017-06-03 NOTE — Progress Notes (Signed)
Subjective:    Patient ID: Shane Cook, male    DOB: 07-29-1941, 76 y.o.   MRN: [id number]  HPI He reports he has noticed fatigue for 18-24 months that seems to be progressively worsening. He has noticed it especially when cutting his grass. Wife reports he has endorsed dyspnea on exertion. No noticeable cough but his wife reports he does wheeze occasionally at night with coughing. Has questionable apneic episodes while sleeping with significant snoring. He reports good quality of sleep at night. No morning headaches. He does nap throughout the day, intermittently. Does doze off easily. He reports he did have chest discomfort a few years ago but none since. No persistent headaches. No focal weakness, numbness, or tingling. No fever, chills, or sweats. He has lost about 10 pounds in the last 10 days on a new diet. No adenopathy in his neck, groin, or axilla. He does feel his voice is more raspy in the last 2 weeks. No dyspepsia or odynophagia. No dysphagia or reflux. No morning brash water taste. No abdominal pain. No nausea or emesis. Patient was diagnosed with "asthma" some years ago with his episode of chest discomfort. No breathing problems or asthma as a child. He does have some seasonal sinus congestion & drainage. No history of bronchitis or pneumonia. He was prescribed ProAir and has used it more in the last 3 months - about 6 times - without any significant difference in his symptoms.   Review of Systems No rashes or abnormal bruising. He reports no joint swelling, erythema, or stiffness. Previously did have diffuse joint pain some years ago without a discovered cause. This was treated with Prednisone. A pertinent 14 point review of systems is negative except as per the history of presenting illness.  Allergies  Allergen Reactions  . Sulfa Antibiotics     Questionable allergy  . Amoxicillin Rash    Has patient had a PCN reaction causing immediate rash, facial/tongue/throat swelling, SOB  or lightheadedness with hypotension: Yes Has patient had a PCN reaction causing severe rash involving mucus membranes or skin necrosis: No Has patient had a PCN reaction that required hospitalization: No Has patient had a PCN reaction occurring within the last 10 years: No If all of the above answers are "NO", then may proceed with Cephalosporin use.     Current Outpatient Prescriptions on File Prior to Visit  Medication Sig Dispense Refill  . acetaminophen (TYLENOL) 650 MG CR tablet Take 1,300 mg by mouth every 8 (eight) hours as needed for pain.    Marland Kitchen albuterol (PROVENTIL HFA;VENTOLIN HFA) 108 (90 BASE) MCG/ACT inhaler Inhale 2 puffs into the lungs every 4 (four) hours as needed for shortness of breath.     Marland Kitchen atorvastatin (LIPITOR) 40 MG tablet Take 20 mg by mouth daily.    . beta carotene w/minerals (OCUVITE) tablet Take 1 tablet by mouth daily.    . cetirizine (KLS ALLER-TEC) 10 MG tablet Take 10 mg by mouth daily.    . finasteride (PROSCAR) 5 MG tablet Take 5 mg by mouth daily.    . fluticasone (FLONASE) 50 MCG/ACT nasal spray Place 2 sprays into the nose daily as needed for rhinitis or allergies.     . hydrochlorothiazide (HYDRODIURIL) 25 MG tablet Take 12.5 mg by mouth daily.     . Hypertonic Nasal Wash (SINUS RINSE NA) Place 1 Dose into the nose daily as needed (congestion).    Marland Kitchen levothyroxine (SYNTHROID, LEVOTHROID) 125 MCG tablet Take 125 mcg by mouth daily.     Marland Kitchen  mometasone (NASONEX) 50 MCG/ACT nasal spray Place 2 sprays into the nose 2 (two) times daily as needed (allergies).    Marland Kitchen testosterone cypionate (DEPO-TESTOSTERONE) 200 MG/ML injection Inject 100 mg into the muscle every Thursday.     No current facility-administered medications on file prior to visit.     Past Medical History:  Diagnosis Date  . Allergic rhinitis   . Anemia   . Arthritis   . Asthma    no problems recently  . Colon polyp   . Decreased hearing   . Dupuytren contracture   . ED (erectile  dysfunction)   . Edema    takes hydrochlorothiazide  . Heart murmur    hx yrs ago  . Hypercholesterolemia   . Hypertension   . Low testosterone   . Nephritis    76 yrs old  hospitalized. no problem since  . Peyronie's disease 06/2004  . Polymyalgia rheumatica (HCC)     Past Surgical History:  Procedure Laterality Date  . ACHILLES TENDON REPAIR Right   . CATARACT EXTRACTION     bilateral  . HERNIA REPAIR    . KNEE ARTHROSCOPY     left  . LEFT HEART CATH AND CORONARY ANGIOGRAPHY N/A 05/04/2017   Procedure: Left Heart Cath and Coronary Angiography;  Surgeon: Belva Crome, MD;  Location: [facility];  Service: Cardiovascular;  Laterality: N/A;  . MOUTH SURGERY     cyst 76 yrs old ?ethmoid  . peyronies  08?   penis bent/  . ROTATOR CUFF REPAIR     bilat 20 years apart  . TONSILLECTOMY    . TOTAL KNEE ARTHROPLASTY Left 08/30/2013   Dr Rhona Raider  . TOTAL KNEE ARTHROPLASTY Left 08/30/2013   Procedure: TOTAL KNEE ARTHROPLASTY;  Surgeon: Hessie Dibble, MD;  Location: [facility];  Service: Orthopedics;  Laterality: Left;    Family History  Problem Relation Age of Onset  . Alzheimer's disease Mother   . Lung cancer Father   . Emphysema Father   . Liver cancer Father   . Asthma Grandchild   . Colon cancer Neg Hx   . Rheumatologic disease Neg Hx     Social History   Social History  . Marital status: Married    Spouse name: N/A  . Number of children: N/A  . Years of education: N/A   Social History Main Topics  . Smoking status: Former Smoker    Packs/day: 2.00    Years: 21.00    Types: Cigarettes    Start date: 04/24/1954    Quit date: 05/28/1975  . Smokeless tobacco: Never Used  . Alcohol use No  . Drug use: No  . Sexual activity: Not Asked     Comment: MARRIED   Other Topics Concern  . None   Social History Narrative   [facility] (06/03/17):   Originally from [facility]. He has only lived [place] only. Has prior travel to [place]:  [place], [place],  [place] Social research officer, government. Has also traveled to [place]. Previously worked for [employer] in [employer]. 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 No known mold or asbestos exposure. 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 No pets currently. No bird exposure. No hot tub exposure.       Objective:   Physical Exam BP 128/64 (BP Location: Left Arm, Patient Position: Sitting, Cuff Size: Normal)   Pulse 93   Ht 5'  10" (1.778 m)   Wt 198 lb 3.2 oz (89.9 kg)   SpO2 93%   BMI 28.44 kg/m  General:  Awake. Alert. No acute distress. Caucasian male. Integument:  Warm & dry. No rash on exposed skin. No bruising on exposed skin. Extremities:  No cyanosis or clubbing.  Lymphatics:  No appreciated cervical or supraclavicular lymphadenoapthy. HEENT:  Moist mucus membranes. No oral ulcers. No scleral injection or icterus. Minimal nasal turbinate swelling. Mallampati class III. Cardiovascular:  Regular rate. No edema. No appreciable JVD.  Pulmonary:  Good aeration & clear to auscultation bilaterally. Symmetric chest wall expansion. No accessory muscle use on room air. Abdomen: Soft. Normal bowel sounds. Nondistended. Grossly nontender. Musculoskeletal:  Normal bulk and tone. Hand grip strength 5/5 bilaterally. No joint deformity or effusion appreciated. Neurological:  CN 2-12 grossly in tact. No meningismus. Moving all 4 extremities equally. Symmetric brachioradialis deep tendon reflexes. Psychiatric:  Mood and affect congruent. Speech normal rhythm, rate & tone.   IMAGING CXR PA/LAT 03/04/17 (personally reviewed by me):  Questionable scoring/linear opacification left lung base. No peripheral consolidation or mass appreciated. No pleural effusion. Heart normal in size & mediastinum normal in contour.  CARDIAC LHC (05/04/17):  Normal coronary arteries.  Normal left ventricular systolic function, EF 74%, with  upper normal LVEDP of 18 mmHg. LVEDP suggests mild diastolic dysfunction.  False positive myocardial perfusion imaging.  TTE (04/09/17):  LV normal in size with mild LVH. No wall motion abnormalities. Normal diastolic function. LA & RA normal in size. RV normal in size and function. No aortic stenosis or regurgitation. Aortic root normal in size. No mitral stenosis or regurgitation. Mild pulmonic regurgitation without stenosis. Mild tricuspid regurgitation. No pericardial effusion.    Assessment & Plan:  76 y.o. male with prior history of arthritis as well as snoring and symptoms of fatigue concerning for underlying sleep apnea. Patient reports he is scheduled for a polysomnogram in 2 weeks. This could certainly account for his fatigue. However, his dyspnea on exertion and saturation of 93% today on room air when coupled with the questionable left basilar "scarring" does raise the possibility of underlying parenchymal lung disease including interstitial lung disease/pulmonary fibrosis. However, the patient has no other symptoms that would suggest a lung problem. As such, I'm holding off on further chest imaging and serum workup of his arthritis pending the results of his pulmonary function testing.  1. Fatigue/snoring: Suspect underlying sleep apnea. Patient scheduled for polysomnogram already. Await this result. 2. Dyspnea on exertion: Checking full pulmonary function testing and 6 minute walk test on room air before next appointment. Further imaging pending these results. 3. Arthritis: Holding on serum autoimmune workup. Previously seen by rheumatology and diagnosed with polymyalgia rheumatica. 4. Follow-up: Patient to return to clinic in 8 weeks or sooner if needed.  Sonia Baller Ashok Cordia, M.D. [facility] Pager:  [phone] After 3pm or if no response, call [phone] 5:48 PM 06/03/17

## 2017-06-04 NOTE — Progress Notes (Signed)
Thank you! Shane Cook 

## 2017-06-24 DIAGNOSIS — N401 Enlarged prostate with lower urinary tract symptoms: Secondary | ICD-10-CM | POA: Diagnosis not present

## 2017-06-24 DIAGNOSIS — E291 Testicular hypofunction: Secondary | ICD-10-CM | POA: Diagnosis not present

## 2017-07-03 ENCOUNTER — Ambulatory Visit (HOSPITAL_BASED_OUTPATIENT_CLINIC_OR_DEPARTMENT_OTHER): Payer: Medicare Other | Attending: Interventional Cardiology | Admitting: Cardiology

## 2017-07-03 VITALS — Ht 70.0 in | Wt 187.0 lb

## 2017-07-03 DIAGNOSIS — R400 Somnolence: Secondary | ICD-10-CM

## 2017-07-03 DIAGNOSIS — G4736 Sleep related hypoventilation in conditions classified elsewhere: Secondary | ICD-10-CM | POA: Insufficient documentation

## 2017-07-03 DIAGNOSIS — R0683 Snoring: Secondary | ICD-10-CM

## 2017-07-03 DIAGNOSIS — G4733 Obstructive sleep apnea (adult) (pediatric): Secondary | ICD-10-CM | POA: Diagnosis not present

## 2017-07-07 NOTE — Procedures (Signed)
   Patient Name: Shane Cook, Shane Cook Date: 07/03/2017 Gender: Male D.O.B: [birth year] Age (years): 76 Referring Provider: Daneen Schick Height (inches): 20 Interpreting Physician: Fransico Him MD, ABSM Weight (lbs): 187 RPSGT: Madelon Lips BMI: 27 MRN: [id number] Neck Size: 15.00 &nbsp; &nbsp;  CLINICAL INFORMATION Sleep Study Type: NPSG  Indication for sleep study: Excessive Daytime Sleepiness, Snoring  Epworth Sleepiness Score: 7  SLEEP STUDY TECHNIQUE As per the AASM Manual for the Scoring of Sleep and Associated Events v2.3 (April 2016) with a hypopnea requiring 4% desaturations.  The channels recorded and monitored were frontal, central and occipital EEG, electrooculogram (EOG), submentalis EMG (chin), nasal and oral airflow, thoracic and abdominal wall motion, anterior tibialis EMG, snore microphone, electrocardiogram, and pulse oximetry.  MEDICATIONS Medications self-administered by patient taken the night of the study : N/A  SLEEP ARCHITECTURE The study was initiated at 10:44:17 PM and ended at 5:02:07 AM.  Sleep onset time was 12.0 minutes and the sleep efficiency was 75.4%. The total sleep time was 285.0 minutes.  Stage REM latency was 77.0 minutes.  The patient spent 23.51% of the night in stage N1 sleep, 65.61% in stage N2 sleep, 0.00% in stage N3 and 10.88% in REM.  Alpha intrusion was absent.  Supine sleep was 100.00%.  RESPIRATORY PARAMETERS The overall apnea/hypopnea index (AHI) was 13.9 per hour. There were 44 total apneas, including 42 obstructive, 1 central and 1 mixed apneas. There were 22 hypopneas and 47 RERAs.  The AHI during Stage REM sleep was 5.8 per hour.  AHI while supine was 13.9 per hour.  The mean oxygen saturation was 92.39%. The minimum SpO2 during sleep was 83.00%.  Moderate snoring was noted during this study.  CARDIAC DATA The 2 lead EKG demonstrated sinus rhythm. The mean heart rate was 58.24 beats per minute. Other EKG  findings include: None.  LEG MOVEMENT DATA The total PLMS were 1 with a resulting PLMS index of 0.21. Associated arousal with leg movement index was 0.0 .  IMPRESSIONS - Mild obstructive sleep apnea occurred during this study (AHI = 13.9/h). - No significant central sleep apnea occurred during this study (CAI = 0.2/h). - Mild oxygen desaturation was noted during this study (Min O2 = 83.00%). - The patient snored with Moderate snoring volume. - No cardiac abnormalities were noted during this study. - Clinically significant periodic limb movements did not occur during sleep. No significant associated arousals.  DIAGNOSIS - Obstructive Sleep Apnea (327.23 [G47.33 ICD-10]) - Nocturnal Hypoxemia (327.26 [G47.36 ICD-10])  RECOMMENDATIONS - Therapeutic CPAP titration to determine optimal pressure required to alleviate sleep disordered breathing. - Positional therapy avoiding supine position during sleep. - Avoid alcohol, sedatives and other CNS depressants that may worsen sleep apnea and disrupt normal sleep architecture. - Sleep hygiene should be reviewed to assess factors that may improve sleep quality. - Weight management and regular exercise should be initiated or continued if appropriate.  [Electronically signed] 07/07/2017 10:12 PM  Fransico Him MD, ABSM Diplomate, American Board of Sleep Medicine

## 2017-07-08 ENCOUNTER — Encounter: Payer: Self-pay | Admitting: Interventional Cardiology

## 2017-07-09 ENCOUNTER — Telehealth: Payer: Self-pay | Admitting: *Deleted

## 2017-07-09 DIAGNOSIS — G4733 Obstructive sleep apnea (adult) (pediatric): Secondary | ICD-10-CM

## 2017-07-09 NOTE — Telephone Encounter (Signed)
-----   Message from Sueanne Margarita, MD sent at 07/07/2017 10:18 PM EDT ----- Please let patient know that they have sleep apnea and recommend CPAP titration. Please set up titration in the sleep lab.

## 2017-07-09 NOTE — Telephone Encounter (Signed)
LMTCB

## 2017-07-14 NOTE — Telephone Encounter (Deleted)
-----   Message from Sueanne Margarita, MD sent at 07/07/2017 10:18 PM EDT ----- Please let patient know that they have sleep apnea and recommend CPAP titration. Please set up titration in the sleep lab.

## 2017-07-14 NOTE — Telephone Encounter (Addendum)
Informed patient of sleep study results and patient understanding was verbalized. Patient understands Dr Radford Pax has recommended a CPAP titration in lab. Patient understands his Titration will be done at [facility] sleep lab. Patient understands her sleep study is scheduled for Wednesday August 05 2017. Patient understands he will receive a sleep packet in a week or so. Patient understands to call if he does not receive the sleep packet in a timely manner. Patient agrees with treatment and thanked me for call.

## 2017-07-15 ENCOUNTER — Encounter: Payer: Self-pay | Admitting: *Deleted

## 2017-07-15 NOTE — Addendum Note (Signed)
Addended by: Freada Bergeron on: 07/15/2017 03:46 PM   Modules accepted: Orders

## 2017-07-17 DIAGNOSIS — N5201 Erectile dysfunction due to arterial insufficiency: Secondary | ICD-10-CM | POA: Diagnosis not present

## 2017-07-17 DIAGNOSIS — E291 Testicular hypofunction: Secondary | ICD-10-CM | POA: Diagnosis not present

## 2017-07-22 ENCOUNTER — Encounter: Payer: Self-pay | Admitting: *Deleted

## 2017-07-22 ENCOUNTER — Ambulatory Visit (INDEPENDENT_AMBULATORY_CARE_PROVIDER_SITE_OTHER): Payer: Medicare Other | Admitting: Pulmonary Disease

## 2017-07-22 DIAGNOSIS — R0609 Other forms of dyspnea: Secondary | ICD-10-CM

## 2017-07-22 LAB — PULMONARY FUNCTION TEST
DL/VA % pred: 117 %
DL/VA: 5.37 ml/min/mmHg/L
DLCO COR: 25.35 ml/min/mmHg
DLCO UNC % PRED: 76 %
DLCO UNC: 24.68 ml/min/mmHg
DLCO cor % pred: 78 %
FEF 25-75 Post: 1.94 L/sec
FEF 25-75 Pre: 1.57 L/sec
FEF2575-%CHANGE-POST: 23 %
FEF2575-%Pred-Post: 91 %
FEF2575-%Pred-Pre: 74 %
FEV1-%CHANGE-POST: 3 %
FEV1-%PRED-POST: 77 %
FEV1-%PRED-PRE: 74 %
FEV1-POST: 2.3 L
FEV1-PRE: 2.21 L
FEV1FVC-%CHANGE-POST: 5 %
FEV1FVC-%PRED-PRE: 101 %
FEV6-%Change-Post: -1 %
FEV6-%Pred-Post: 76 %
FEV6-%Pred-Pre: 77 %
FEV6-POST: 2.97 L
FEV6-Pre: 3.02 L
FEV6FVC-%CHANGE-POST: 0 %
FEV6FVC-%PRED-POST: 106 %
FEV6FVC-%Pred-Pre: 106 %
FVC-%Change-Post: -1 %
FVC-%PRED-PRE: 72 %
FVC-%Pred-Post: 72 %
FVC-POST: 2.98 L
FVC-PRE: 3.02 L
POST FEV1/FVC RATIO: 77 %
Post FEV6/FVC ratio: 100 %
Pre FEV1/FVC ratio: 73 %
Pre FEV6/FVC Ratio: 100 %

## 2017-07-22 NOTE — Progress Notes (Signed)
PFT done today. 

## 2017-07-23 ENCOUNTER — Encounter: Payer: Self-pay | Admitting: Pulmonary Disease

## 2017-07-23 ENCOUNTER — Ambulatory Visit (INDEPENDENT_AMBULATORY_CARE_PROVIDER_SITE_OTHER): Payer: Medicare Other | Admitting: Pulmonary Disease

## 2017-07-23 ENCOUNTER — Ambulatory Visit (INDEPENDENT_AMBULATORY_CARE_PROVIDER_SITE_OTHER): Payer: Medicare Other | Admitting: *Deleted

## 2017-07-23 VITALS — BP 124/70 | HR 64 | Ht 70.0 in | Wt 186.0 lb

## 2017-07-23 DIAGNOSIS — G4733 Obstructive sleep apnea (adult) (pediatric): Secondary | ICD-10-CM | POA: Diagnosis not present

## 2017-07-23 DIAGNOSIS — R0602 Shortness of breath: Secondary | ICD-10-CM | POA: Diagnosis not present

## 2017-07-23 DIAGNOSIS — J984 Other disorders of lung: Secondary | ICD-10-CM | POA: Diagnosis not present

## 2017-07-23 DIAGNOSIS — R0609 Other forms of dyspnea: Secondary | ICD-10-CM | POA: Diagnosis not present

## 2017-07-23 DIAGNOSIS — R499 Unspecified voice and resonance disorder: Secondary | ICD-10-CM

## 2017-07-23 HISTORY — DX: Other disorders of lung: J98.4

## 2017-07-23 HISTORY — DX: Unspecified voice and resonance disorder: R49.9

## 2017-07-23 NOTE — Progress Notes (Signed)
SIX MIN WALK 07/23/2017  Medications Lipitor 40mg , Proscar 5mg , Hydrodiuril 25mg , synthoid 179mcg & ocuvite all taken at 8:00am  Supplimental Oxygen during Test? (L/min) No  Laps 6  Partial Lap (in Meters) 16  Baseline BP (sitting) 122/70  Baseline Heartrate 75  Baseline Dyspnea (Borg Scale) 3  Baseline Fatigue (Borg Scale) 0  Baseline SPO2 99  BP (sitting) 132/68  Heartrate 102  Dyspnea (Borg Scale) 3  Fatigue (Borg Scale) 0.5  SPO2 98  BP (sitting) 126/64  Heartrate 82  SPO2 98  Stopped or Paused before Six Minutes No  Distance Completed 304  Tech Comments: test performed with forehead probe. pt walked at slow pace with no complaints or desats.

## 2017-07-23 NOTE — Progress Notes (Signed)
Subjective:    Patient ID: Shane Cook, male    DOB: [birth year], 76 y.o.   MRN: [id number]  C.C.:  Follow-up for Dyspnea on Exertion & Mild OSA.  HPI Dyspnea on Exertion: Pulmonary function testing did not reveal any airway obstruction on spirometry or significant bronchodilator response but did reveal a moderate restriction despite normal carbon monoxide diffusion capacity. Previously seen by rheumatology and diagnosed with polymyalgia rheumatica. He does feel his dyspnea is unchanged compared with last appointment. Still is noticing it with exertion. He denies any coughing but does have a raspy quality to his voice.   Mild OSA: Identified on polysomnogram earlier this month. Not currently on CPAP therapy. Per my review of the electronic medical record patient has been referred for CPAP titration.  Review of Systems No chest pain, tightness, or pressure. No fever or chills. No new rashes or bruising. Denies any reflux or dyspepsia. No morning brash water taste.   Allergies  Allergen Reactions  . Sulfa Antibiotics     Questionable allergy  . Amoxicillin Rash    Has patient had a PCN reaction causing immediate rash, facial/tongue/throat swelling, SOB or lightheadedness with hypotension: Yes Has patient had a PCN reaction causing severe rash involving mucus membranes or skin necrosis: No Has patient had a PCN reaction that required hospitalization: No Has patient had a PCN reaction occurring within the last 10 years: No If all of the above answers are "NO", then may proceed with Cephalosporin use.     Current Outpatient Prescriptions on File Prior to Visit  Medication Sig Dispense Refill  . acetaminophen (TYLENOL) 650 MG CR tablet Take 1,300 mg by mouth every 8 (eight) hours as needed for pain.    Marland Kitchen albuterol (PROVENTIL HFA;VENTOLIN HFA) 108 (90 BASE) MCG/ACT inhaler Inhale 2 puffs into the lungs every 4 (four) hours as needed for shortness of breath.     Marland Kitchen atorvastatin (LIPITOR) 40  MG tablet Take 20 mg by mouth daily.    . beta carotene w/minerals (OCUVITE) tablet Take 1 tablet by mouth daily.    . cetirizine (KLS ALLER-TEC) 10 MG tablet Take 10 mg by mouth daily.    . finasteride (PROSCAR) 5 MG tablet Take 5 mg by mouth daily.    . fluticasone (FLONASE) 50 MCG/ACT nasal spray Place 2 sprays into the nose daily as needed for rhinitis or allergies.     . hydrochlorothiazide (HYDRODIURIL) 25 MG tablet Take 12.5 mg by mouth daily.     . Hypertonic Nasal Wash (SINUS RINSE NA) Place 1 Dose into the nose daily as needed (congestion).    Marland Kitchen levothyroxine (SYNTHROID, LEVOTHROID) 125 MCG tablet Take 125 mcg by mouth daily.     . mometasone (NASONEX) 50 MCG/ACT nasal spray Place 2 sprays into the nose 2 (two) times daily as needed (allergies).    Marland Kitchen testosterone cypionate (DEPO-TESTOSTERONE) 200 MG/ML injection Inject 100 mg into the muscle every Thursday.     No current facility-administered medications on file prior to visit.     Past Medical History:  Diagnosis Date  . Allergic rhinitis   . Anemia   . Arthritis   . Asthma    no problems recently  . Colon polyp   . Decreased hearing   . Dupuytren contracture   . ED (erectile dysfunction)   . Edema    takes hydrochlorothiazide  . Heart murmur    hx yrs ago  . Hypercholesterolemia   . Hypertension   . Low testosterone   .  Nephritis    76 yrs old  hospitalized. no problem since  . Peyronie's disease 06/2004  . Polymyalgia rheumatica (HCC)     Past Surgical History:  Procedure Laterality Date  . ACHILLES TENDON REPAIR Right   . CATARACT EXTRACTION     bilateral  . HERNIA REPAIR    . KNEE ARTHROSCOPY     left  . LEFT HEART CATH AND CORONARY ANGIOGRAPHY N/A 05/04/2017   Procedure: Left Heart Cath and Coronary Angiography;  Surgeon: Belva Crome, MD;  Location: [facility];  Service: Cardiovascular;  Laterality: N/A;  . MOUTH SURGERY     cyst 76 yrs old ?ethmoid  . peyronies  08?   penis bent/  .  ROTATOR CUFF REPAIR     bilat 20 years apart  . TONSILLECTOMY    . TOTAL KNEE ARTHROPLASTY Left 08/30/2013   Dr Rhona Raider  . TOTAL KNEE ARTHROPLASTY Left 08/30/2013   Procedure: TOTAL KNEE ARTHROPLASTY;  Surgeon: Hessie Dibble, MD;  Location: [facility];  Service: Orthopedics;  Laterality: Left;    Family History  Problem Relation Age of Onset  . Alzheimer's disease Mother   . Lung cancer Father   . Emphysema Father   . Liver cancer Father   . Asthma Grandchild   . Colon cancer Neg Hx   . Rheumatologic disease Neg Hx     Social History   Social History  . Marital status: Married    Spouse name: N/A  . Number of children: N/A  . Years of education: N/A   Social History Main Topics  . Smoking status: Former Smoker    Packs/day: 2.00    Years: 21.00    Types: Cigarettes    Start date: 04/24/1954    Quit date: 05/28/1975  . Smokeless tobacco: Never Used  . Alcohol use No  . Drug use: No  . Sexual activity: Not Asked     Comment: MARRIED   Other Topics Concern  . None   Social History Narrative   [facility] (06/03/17):   Originally from [facility]. He has only lived [place] & [place] only. 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 Has also traveled to [place]. Previously worked for [employer] in [employer]. 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 No known mold or asbestos exposure. 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 No pets currently. No bird exposure. No hot tub exposure.       Objective:   Physical Exam BP 124/70 (BP Location: Right Arm, Cuff Size: Normal)   Pulse 64   Ht 5\' 10"  (1.778 m)   Wt 186 lb (84.4 kg)   SpO2 96%   BMI 26.69 kg/m   General:  Thin Caucasian male. Accompanied by wife today. No distress. Integument:  Warm & dry. No rash on exposed skin.No bruising. Extremities:  No cyanosis or clubbing.  HEENT:  Moist mucus membranes. No  nasal turbinate swelling. No scleral icterus.  Cardiovascular:  Regular rate. No edema. Unable to appreciate JVD. Pulmonary:  Clear with auscultation bilaterally. Normal work of breathing on room air. Abdomen: Soft. Normal bowel sounds. Minimally protuberant. Musculoskeletal:  Normal bulk and tone. No joint deformity or effusion appreciated.  PFT 07/22/17: FVC 3.02 L (72%) FEV1 2.21 L (74%) FEV1/FVC 0.73 FEF 25-75 1.57 L (74%) negative bronchodilator response TLC 3.90 L (55%) RV 55% ERV 120% DLCO corrected 78%  6MWT  07/23/17:  Walked 304 meters / Baseline Sat 99% on RA / Nadir Sat 98% on RA @ end of test  POLYSOMNOGRAM (07/03/17): IMPRESSIONS - Mild obstructive sleep apnea occurred during this study (AHI = 13.9/h). - No significant central sleep apnea occurred during this study (CAI = 0.2/h). - Mild oxygen desaturation was noted during this study (Min O2 = 83.00%). - The patient snored with Moderate snoring volume. - No cardiac abnormalities were noted during this study. - Clinically significant periodic limb movements did not occur during sleep. No significant associated arousals.  IMAGING CXR PA/LAT 03/04/17 (Previously reviewed by me):  Questionable scoring/linear opacification left lung base. No peripheral consolidation or mass appreciated. No pleural effusion. Heart normal in size & mediastinum normal in contour.  CARDIAC LHC (05/04/17):  Normal coronary arteries.  Normal left ventricular systolic function, EF 16%, with upper normal LVEDP of 18 mmHg. LVEDP suggests mild diastolic dysfunction.  False positive myocardial perfusion imaging.  TTE (04/09/17):  LV normal in size with mild LVH. No wall motion abnormalities. Normal diastolic function. LA & RA normal in size. RV normal in size and function. No aortic stenosis or regurgitation. Aortic root normal in size. No mitral stenosis or regurgitation. Mild pulmonic regurgitation without stenosis. Mild tricuspid regurgitation. No  pericardial effusion.    Assessment & Plan:  76 y.o. male with history of arthritis and polymyalgia rheumatica. Recently diagnosed with sleep apnea which could be contributing to some element of fatigue. I reviewed his pulmonary function testing performed recently which shows moderate restrictive lung disease of uncertain etiology. Given his voice changes and previous autoimmune disease I do question whether or not he could have occult interstitial lung disease. As such, I'm holding off on further serum workup pending the results of his high-resolution CT scan. I instructed the patient to notify me if he had any new breathing problems or questions before his next appointment.  1. Dyspnea on exertion:  Likely multifactorial. Evaluating pulmonary parenchyma with high-resolution CT. 2. Restrictive lung disease:  Checking high-resolution CT chest without contrast. Further serum autoimmune workup pending this result. Repeat spirometry with DLCO and 6 minute walk test on room air at next appointment. 3. Mild OSA:  Patient encouraged to follow through with CPAP trial and titration. 4. Voice changes: Recommended trial of Zantac daily at bedtime. Patient to notify me if no better. May require ENT evaluation. Also discussed possibility of autoimmune contribution with arytenoid cartilage involvement. 5. Follow-up: Return to clinic in 3 months or sooner if needed.  Sonia Baller Ashok Cordia, M.D. [facility] Pager:  [phone] After 3pm or if no response, call [phone] 11:45 AM 07/23/17

## 2017-07-23 NOTE — Patient Instructions (Addendum)
   Call me if you have any new breathing problems or questions before your next appointment.  Try taking Zantac 75mg  at night before bed.   I will see you back in 3 months or sooner if needed.   TESTS ORDERED: 1. HRCT CHEST W/O with Prone & Supine imaging 2. Spirometry with DLCO at next appointment 3. 6MWT on room air at next appointment

## 2017-07-30 ENCOUNTER — Ambulatory Visit (INDEPENDENT_AMBULATORY_CARE_PROVIDER_SITE_OTHER)
Admission: RE | Admit: 2017-07-30 | Discharge: 2017-07-30 | Disposition: A | Discharge status: Home / Self Care | Payer: Medicare Other | Source: Ambulatory Visit | Attending: Pulmonary Disease | Admitting: Pulmonary Disease

## 2017-07-30 DIAGNOSIS — R0602 Shortness of breath: Secondary | ICD-10-CM | POA: Diagnosis not present

## 2017-07-30 DIAGNOSIS — R911 Solitary pulmonary nodule: Secondary | ICD-10-CM | POA: Diagnosis not present

## 2017-08-05 ENCOUNTER — Ambulatory Visit (HOSPITAL_BASED_OUTPATIENT_CLINIC_OR_DEPARTMENT_OTHER): Payer: Medicare Other | Attending: Cardiology | Admitting: Cardiology

## 2017-08-05 ENCOUNTER — Telehealth: Payer: Self-pay | Admitting: Pulmonary Disease

## 2017-08-05 VITALS — Ht 70.0 in | Wt 187.0 lb

## 2017-08-05 DIAGNOSIS — G4733 Obstructive sleep apnea (adult) (pediatric): Secondary | ICD-10-CM | POA: Diagnosis not present

## 2017-08-05 NOTE — Telephone Encounter (Signed)
Called and spoke with pt's spouse, Zigmund Daniel.  Zigmund Daniel is requesting pt's CT results from 07/30/17  JN please advise. Thanks.

## 2017-08-06 ENCOUNTER — Encounter: Payer: Self-pay | Admitting: Gastroenterology

## 2017-08-06 NOTE — Telephone Encounter (Signed)
Patient's wife is following up results. Patient is aware nurse is waiting on a response from provider.

## 2017-08-06 NOTE — Procedures (Signed)
   Patient Name: Shane Cook, Shane Cook Date: 08/05/2017 Gender: Male D.O.B: [birth year] Age (years): 37 Referring Provider: Fransico Him MD, ABSM Height (inches): 70 Interpreting Physician: Fransico Him MD, ABSM Weight (lbs): 187 RPSGT: [name] BMI: 27 MRN: [id number] Neck Size: 15.00  CLINICAL INFORMATION The patient is referred for a CPAP titration to treat sleep apnea.  Date of NPSG, Split Night or HST: 07/03/2017  SLEEP STUDY TECHNIQUE As per the AASM Manual for the Scoring of Sleep and Associated Events v2.3 (April 2016) with a hypopnea requiring 4% desaturations.  The channels recorded and monitored were frontal, central and occipital EEG, electrooculogram (EOG), submentalis EMG (chin), nasal and oral airflow, thoracic and abdominal wall motion, anterior tibialis EMG, snore microphone, electrocardiogram, and pulse oximetry. Continuous positive airway pressure (CPAP) was initiated at the beginning of the study and titrated to treat sleep-disordered breathing.  MEDICATIONS Medications self-administered by patient taken the night of the study : N/A  TECHNICIAN COMMENTS Comments added by technician: NONE  Comments added by scorer: N/A   RESPIRATORY PARAMETERS Optimal PAP Pressure (cm): 9  AHI at Optimal Pressure (/hr):0.0 Overall Minimal O2 (%):89.00  Supine % at Optimal Pressure (%): 100 Minimal O2 at Optimal Pressure (%): 92.0    SLEEP ARCHITECTURE The study was initiated at 10:12:44 PM and ended at 5:23:45 AM.  Sleep onset time was 3.4 minutes and the sleep efficiency was 88.4%. The total sleep time was 381.0 minutes.  The patient spent 4.07% of the night in stage N1 sleep, 76.25% in stage N2 sleep, 0.00% in stage N3 and 19.69% in REM.Stage REM latency was 73.5 minutes  Wake after sleep onset was 46.6. Alpha intrusion was absent. Supine sleep was 100.00%.  CARDIAC DATA The 2 lead EKG demonstrated NSR. The mean heart rate was 67.90 beats per minute.  Other EKG findings include: None.  LEG MOVEMENT DATA The total Periodic Limb Movements of Sleep (PLMS) were 38. The PLMS index was 5.98. A PLMS index of <15 is considered normal in adults.  IMPRESSIONS - The optimal PAP pressure was 9 cm of water. - Central sleep apnea was not noted during this titration (CAI = 0.0/h). - Moderate oxygen desaturations were observed during this titration (min O2 = 89.00%). - The patient snored with Moderate snoring volume during this titration study. - No cardiac abnormalities were observed during this study. - Mild periodic limb movements were observed during this study. Arousals associated with PLMs were rare.  DIAGNOSIS - Obstructive Sleep Apnea (327.23 [G47.33 ICD-10])  RECOMMENDATIONS - Trial of CPAP therapy on 9 cm H2O with a Medium size Resmed Full Face Mask AirFit F20 mask and heated humidification. - Avoid alcohol, sedatives and other CNS depressants that may worsen sleep apnea and disrupt normal sleep architecture. - Sleep hygiene should be reviewed to assess factors that may improve sleep quality. - Weight management and regular exercise should be initiated or continued. - Return to Sleep Center for re-evaluation after 10 weeks of therapy  [place]  ELECTRONICALLY SIGNED ON:  08/06/2017, 11:42 PM [place] PH: [phone]   FX: [phone] [place]

## 2017-08-07 ENCOUNTER — Telehealth: Payer: Self-pay | Admitting: *Deleted

## 2017-08-07 NOTE — Telephone Encounter (Signed)
-----   Message from Sueanne Margarita, MD sent at 08/06/2017 11:46 PM EDT ----- Please let patient know that they had a successful PAP titration and let DME know that orders are in EPIC.  Please set up 10 week OV with me.

## 2017-08-07 NOTE — Telephone Encounter (Signed)
LMTCB

## 2017-08-07 NOTE — Telephone Encounter (Signed)
Patient and wife contacted regarding results of high-resolution CT scan. CT scan previously reviewed by me and there is evidence of very mild subpleural reticulation. Due to computer issues the patient's images are not available to review at the time of my phone call. Notably the patient does have a 3 mm nodule in his right upper lobe. We discussed his prior diagnosis of polymyalgia rheumatica as well as the potential effects of autoimmune disease on both lungs as well as the arytenoid cartilage. The degree of fibrosis is very mild and out of proportion to the moderate restriction seen on his previous lung volumes. As such, I question to what extent these fibrotic changes may be contributing to his dyspnea on exertion. Consequently I'm holding off on serum testing at this time while awaiting his repeat 6 minute walk test and spirometry with DLCO to be performed at his follow-up appointment. We also briefly addressed his intermittent voice changes and the patient has not yet tried Zantac. I recommended to his wife that he try Zantac 150 mg by mouth daily at bedtime for at least 2 weeks and if this did not improve his wish quality that should contact me and we will arrange a follow-up with ENT for direct laryngoscopy. His wife signified understanding.

## 2017-08-17 DIAGNOSIS — L0390 Cellulitis, unspecified: Secondary | ICD-10-CM | POA: Diagnosis not present

## 2017-08-18 NOTE — Telephone Encounter (Signed)
LMTCB

## 2017-08-19 NOTE — Telephone Encounter (Addendum)
Informed patient of titration results and verbalized understanding was indicated. Patient understands his CPAP Titration was successful. Patient understands Dr Radford Pax has ordered him a CPAP in EPIC. Patient understands he will be contacted by [facility] to set up his cpap. He understands to call if [facility] does not contact him with new setup in a timely manner. He understands he will be called once confirmation has been received from [facility] that he has received his new machine to schedule 10 week follow up appointment.  Ardoch notified of new cpap order in epic Please add to Maryfrances Bunnell He was grateful for the call and thanked me

## 2017-09-21 DIAGNOSIS — Z23 Encounter for immunization: Secondary | ICD-10-CM | POA: Diagnosis not present

## 2017-10-02 ENCOUNTER — Encounter: Payer: Self-pay | Admitting: Cardiology

## 2017-10-06 ENCOUNTER — Telehealth: Payer: Self-pay | Admitting: *Deleted
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
## 2017-10-06 NOTE — Telephone Encounter (Signed)
-----   Message from Sueanne Margarita, MD sent at 10/05/2017  2:57 PM EST ----- Good AHI on CPAP but needs to improve compliance

## 2017-10-07 DIAGNOSIS — H353133 Nonexudative age-related macular degeneration, bilateral, advanced atrophic without subfoveal involvement: Secondary | ICD-10-CM | POA: Diagnosis not present

## 2017-10-07 DIAGNOSIS — H43813 Vitreous degeneration, bilateral: Secondary | ICD-10-CM | POA: Diagnosis not present

## 2017-10-07 DIAGNOSIS — H35371 Puckering of macula, right eye: Secondary | ICD-10-CM | POA: Diagnosis not present

## 2017-10-07 DIAGNOSIS — Z961 Presence of intraocular lens: Secondary | ICD-10-CM | POA: Diagnosis not present

## 2017-10-07 DIAGNOSIS — H3509 Other intraretinal microvascular abnormalities: Secondary | ICD-10-CM | POA: Diagnosis not present

## 2017-10-22 ENCOUNTER — Ambulatory Visit (INDEPENDENT_AMBULATORY_CARE_PROVIDER_SITE_OTHER): Payer: Medicare Other | Admitting: *Deleted

## 2017-10-22 DIAGNOSIS — R0609 Other forms of dyspnea: Secondary | ICD-10-CM

## 2017-10-22 NOTE — Progress Notes (Signed)
SIX MIN WALK 10/22/2017 07/23/2017  Medications Lipitor 40mg , Procar 5mg , Hydrodiuril  25mg  and Synthroid all taken at 8:am Lipitor 40mg , Proscar 5mg , Hydrodiuril 25mg , synthoid 158mcg & ocuvite all taken at 8:00am  Supplimental Oxygen during Test? (L/min) No No  Laps 6 6  Partial Lap (in Meters) 0 16  Baseline BP (sitting) 120/62 122/70  Baseline Heartrate 63 75  Baseline Dyspnea (Borg Scale) 0 3  Baseline Fatigue (Borg Scale) 1 0  Baseline SPO2 100 99  BP (sitting) 126/68 132/68  Heartrate 97 102  Dyspnea (Borg Scale) 2 3  Fatigue (Borg Scale) 0.5 0.5  SPO2 99 98  BP (sitting) 124/66 126/64  Heartrate 77 82  SPO2 99 98  Stopped or Paused before Six Minutes No No  Distance Completed 288 304  Tech Comments: test performed with forehead probe. pt completed test at moderate pace with no desats or complaints.  test performed with forehead probe. pt walked at slow pace with no complaints or desats.

## 2017-10-23 ENCOUNTER — Ambulatory Visit (INDEPENDENT_AMBULATORY_CARE_PROVIDER_SITE_OTHER): Payer: Medicare Other | Admitting: Pulmonary Disease

## 2017-10-23 ENCOUNTER — Other Ambulatory Visit (INDEPENDENT_AMBULATORY_CARE_PROVIDER_SITE_OTHER): Payer: Medicare Other

## 2017-10-23 ENCOUNTER — Encounter: Payer: Self-pay | Admitting: Pulmonary Disease

## 2017-10-23 DIAGNOSIS — I2510 Atherosclerotic heart disease of native coronary artery without angina pectoris: Secondary | ICD-10-CM | POA: Diagnosis not present

## 2017-10-23 DIAGNOSIS — J984 Other disorders of lung: Secondary | ICD-10-CM

## 2017-10-23 DIAGNOSIS — R911 Solitary pulmonary nodule: Secondary | ICD-10-CM

## 2017-10-23 DIAGNOSIS — G4733 Obstructive sleep apnea (adult) (pediatric): Secondary | ICD-10-CM

## 2017-10-23 DIAGNOSIS — R0609 Other forms of dyspnea: Secondary | ICD-10-CM | POA: Diagnosis not present

## 2017-10-23 DIAGNOSIS — J849 Interstitial pulmonary disease, unspecified: Secondary | ICD-10-CM

## 2017-10-23 DIAGNOSIS — Z9989 Dependence on other enabling machines and devices: Secondary | ICD-10-CM

## 2017-10-23 DIAGNOSIS — IMO0001 Reserved for inherently not codable concepts without codable children: Secondary | ICD-10-CM

## 2017-10-23 HISTORY — DX: Dependence on other enabling machines and devices: Z99.89

## 2017-10-23 HISTORY — DX: Obstructive sleep apnea (adult) (pediatric): G47.33

## 2017-10-23 HISTORY — DX: Interstitial pulmonary disease, unspecified: J84.9

## 2017-10-23 HISTORY — DX: Solitary pulmonary nodule: R91.1

## 2017-10-23 LAB — PULMONARY FUNCTION TEST
DL/VA % pred: 101 %
DL/VA: 4.67 ml/min/mmHg/L
DLCO COR: 21.57 ml/min/mmHg
DLCO UNC % PRED: 65 %
DLCO UNC: 21.13 ml/min/mmHg
DLCO cor % pred: 66 %
FEF 25-75 PRE: 1.86 L/s
FEF2575-%Pred-Pre: 88 %
FEV1-%PRED-PRE: 77 %
FEV1-Pre: 2.3 L
FEV1FVC-%PRED-PRE: 106 %
FEV6-%Pred-Pre: 77 %
FEV6-PRE: 3 L
FEV6FVC-%PRED-PRE: 106 %
FVC-%Pred-Pre: 72 %
FVC-Pre: 3 L
PRE FEV1/FVC RATIO: 77 %
PRE FEV6/FVC RATIO: 100 %

## 2017-10-23 LAB — SEDIMENTATION RATE: Sed Rate: 7 mm/hr (ref 0–20)

## 2017-10-23 LAB — C-REACTIVE PROTEIN: CRP: 0.2 mg/dL — ABNORMAL LOW (ref 0.5–20.0)

## 2017-10-23 NOTE — Patient Instructions (Signed)
   Call my office if you have any new breathing problems or questions before your next appointment.  We will repeat your breathing & walking test on or before your next appointment.  We will also review your blood work at your next appointment.   TESTS ORDERED: 1. Spirometry with DLCO at next appointment 2. 6MWT on room air at next appointment  3. Serum ANA, ESR, CRP, Myositis Panel, Smith Antibody, DS DNA Antibody, Sjogrens Panel, Anti-CCP, Rheumatoid Factor, Centromere Antibody Screen, SCL-70, Histone Antibody, & Hypersensitivity Pneumonitis Panel.

## 2017-10-23 NOTE — Progress Notes (Signed)
PFT done today. 

## 2017-10-23 NOTE — Progress Notes (Signed)
Subjective:    Patient ID: Shane Cook, male    DOB: 05-26-1941, 76 y.o.   MRN: [id number]  C.C.:  Follow-up for ILD, OSA, & Right Upper Lobe Nodule.  HPI ILD: Seen on high-resolution CT imaging. Previously diagnosed with polymyalgia rheumatica by rheumatology. Pattern could be consistent with NSIP but is not classic. He reports his baseline dyspnea. No coughing. Previously took prednisone years ago. Denies any joint pain, arthralgias or swelling. No new rashes or bruising.   OSA: Identified on previous polysomnogram. CPAP titration so optimal pressure of 9 cm H2O with a medium size full facemask. He reports he is using his CPAP nightly. He reports he is sleeping 6-8 hours a night. He isn't sure if his sleep quality has improved significantly.   Right upper lobe nodule: 3 mm nodule identified on high-resolution CT scan in September.  Review of Systems No chest pain, pressure or tightness. No fever, chills or sweats. No reflux, dyspepsia, or abdominal discomfort.   Allergies  Allergen Reactions  . Sulfa Antibiotics     Questionable allergy  . Amoxicillin Rash    Has patient had a PCN reaction causing immediate rash, facial/tongue/throat swelling, SOB or lightheadedness with hypotension: Yes Has patient had a PCN reaction causing severe rash involving mucus membranes or skin necrosis: No Has patient had a PCN reaction that required hospitalization: No Has patient had a PCN reaction occurring within the last 10 years: No If all of the above answers are "NO", then may proceed with Cephalosporin use.     Current Outpatient Medications on File Prior to Visit  Medication Sig Dispense Refill  . acetaminophen (TYLENOL) 650 MG CR tablet Take 1,300 mg by mouth every 8 (eight) hours as needed for pain.    Marland Kitchen albuterol (PROVENTIL HFA;VENTOLIN HFA) 108 (90 BASE) MCG/ACT inhaler Inhale 2 puffs into the lungs every 4 (four) hours as needed for shortness of breath.     Marland Kitchen atorvastatin (LIPITOR) 40  MG tablet Take 20 mg by mouth daily.    . beta carotene w/minerals (OCUVITE) tablet Take 1 tablet by mouth daily.    . cetirizine (KLS ALLER-TEC) 10 MG tablet Take 10 mg by mouth daily.    . finasteride (PROSCAR) 5 MG tablet Take 5 mg by mouth daily.    . fluticasone (FLONASE) 50 MCG/ACT nasal spray Place 2 sprays into the nose daily as needed for rhinitis or allergies.     . hydrochlorothiazide (HYDRODIURIL) 25 MG tablet Take 12.5 mg by mouth daily.     . Hypertonic Nasal Wash (SINUS RINSE NA) Place 1 Dose into the nose daily as needed (congestion).    Marland Kitchen levothyroxine (SYNTHROID, LEVOTHROID) 125 MCG tablet Take 125 mcg by mouth daily.     . mometasone (NASONEX) 50 MCG/ACT nasal spray Place 2 sprays into the nose 2 (two) times daily as needed (allergies).    Marland Kitchen testosterone cypionate (DEPO-TESTOSTERONE) 200 MG/ML injection Inject 100 mg into the muscle every Thursday.     No current facility-administered medications on file prior to visit.     Past Medical History:  Diagnosis Date  . Allergic rhinitis   . Anemia   . Arthritis   . Asthma    no problems recently  . Colon polyp   . Decreased hearing   . Dupuytren contracture   . ED (erectile dysfunction)   . Edema    takes hydrochlorothiazide  . Heart murmur    hx yrs ago  . Hypercholesterolemia   .  Hypertension   . Low testosterone   . Nephritis    76 yrs old  hospitalized. no problem since  . Peyronie's disease 06/2004  . Polymyalgia rheumatica (HCC)     Past Surgical History:  Procedure Laterality Date  . ACHILLES TENDON REPAIR Right   . CATARACT EXTRACTION     bilateral  . HERNIA REPAIR    . KNEE ARTHROSCOPY     left  . LEFT HEART CATH AND CORONARY ANGIOGRAPHY N/A 05/04/2017   Procedure: Left Heart Cath and Coronary Angiography;  Surgeon: Belva Crome, MD;  Location: [facility];  Service: Cardiovascular;  Laterality: N/A;  . MOUTH SURGERY     cyst 76 yrs old ?ethmoid  . peyronies  08?   penis bent/  .  ROTATOR CUFF REPAIR     bilat 20 years apart  . TONSILLECTOMY    . TOTAL KNEE ARTHROPLASTY Left 08/30/2013   Dr Rhona Raider  . TOTAL KNEE ARTHROPLASTY Left 08/30/2013   Procedure: TOTAL KNEE ARTHROPLASTY;  Surgeon: Hessie Dibble, MD;  Location: [facility];  Service: Orthopedics;  Laterality: Left;    Family History  Problem Relation Age of Onset  . Alzheimer's disease Mother   . Lung cancer Father   . Emphysema Father   . Liver cancer Father   . Asthma Grandchild   . Colon cancer Neg Hx   . Rheumatologic disease Neg Hx     Social History   Socioeconomic History  . Marital status: Married    Spouse name: None  . Number of children: None  . Years of education: None  . Highest education level: None  Social Needs  . Financial resource strain: None  . Food insecurity - worry: None  . Food insecurity - inability: None  . Transportation needs - medical: None  . Transportation needs - non-medical: None  Occupational History  . None  Tobacco Use  . Smoking status: Former Smoker    Packs/day: 2.00    Years: 21.00    Pack years: 42.00    Types: Cigarettes    Start date: 04/24/1954    Last attempt to quit: 05/28/1975    Years since quitting: 42.4  . Smokeless tobacco: Never Used  Substance and Sexual Activity  . Alcohol use: No  . Drug use: No  . Sexual activity: None    Comment: MARRIED  Other Topics Concern  . None  Social History Narrative   [facility] (06/03/17):   Originally from [facility]. He has only lived [place] only. 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 Has also traveled to [place]. Previously worked for [employer] in [employer]. 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 No known mold or asbestos exposure. 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 No pets currently. No bird exposure. No hot tub exposure.       Objective:   Physical  Exam BP 122/68 (BP Location: Left Arm, Cuff Size: Normal)   Pulse 79   Ht 5\' 10"  (1.778 m)   Wt 187 lb (84.8 kg)   SpO2 96%   BMI 26.83 kg/m   General:  Awake. No distress. Comfortable.  Integument:  No rash. Warm. Dry. Extremities:  No cyanosis or clubbing.  HEENT:  No nasal turbinate swelling. No scleral icterus. Moist mucous membranes. Cardiovascular:  Regular rate. No edema. Regular rhythm.  Pulmonary:  Normal work of breathing  on room air. Clear with auscultation. Abdomen: Soft. Normal bowel sounds. Nondistended.  Musculoskeletal:  Normal bulk and tone. Hand grip strength 5/5 bilaterally. No joint deformity or effusion appreciated. Neurological:  Cranial nerves 2-12 grossly in tact. No meningismus. Moving all 4 extremities equally.   PFT 10/23/17: FVC 3.00 L (32%) FEV1 2.30 L (77%) FEV1/FVC 0.77 FEF 25-75 1.86 L (88%)                                                                                                                             DLCO corrected 66% 07/22/17: FVC 3.02 L (72%) FEV1 2.21 L (74%) FEV1/FVC 0.73 FEF 25-75 1.57 L (74%) negative bronchodilator response TLC 3.90 L (55%) RV 55% ERV 120% DLCO corrected 78%  6MWT 10/22/17:  Walked 288 meters / Baseline Sat 100% on RA / Nadir Sat 99% on RA @ end of test 07/23/17:  Walked 304 meters / Baseline Sat 99% on RA / Nadir Sat 98% on RA @ end of test  CPAP TITRATION (08/05/17):  Recommended trial of CPAP at 9 cm H2O with a Medium size Resmed Full Face Mask AirFit F20 mask and heated humidification.  POLYSOMNOGRAM (07/03/17): IMPRESSIONS - Mild obstructive sleep apnea occurred during this study (AHI = 13.9/h). - No significant central sleep apnea occurred during this study (CAI = 0.2/h). - Mild oxygen desaturation was noted during this study (Min O2 = 83.00%). - The patient snored with Moderate snoring volume. - No cardiac abnormalities were noted during this study. - Clinically significant periodic limb movements did not  occur during sleep. No significant associated arousals.  IMAGING HRCT CHEST W/O 07/30/17 (personally reviewed by me):  Mosaic continuation noted on exhalation suggestive of airways dysfunction. Subpleural reticulation with no obvious cranial caudal progression. This finding is mild. No obvious groundglass opacities, focal consolidation, honeycombing, or bronchiectasis is appreciated. No pleural effusion or thickening. No pericardial effusion. No pathologic mediastinal adenopathy. 3 mm right upper lobe nodule noted.  CXR PA/LAT 03/04/17 (Previously reviewed by me):  Questionable scoring/linear opacification left lung base. No peripheral consolidation or mass appreciated. No pleural effusion. Heart normal in size & mediastinum normal in contour.  CARDIAC LHC (05/04/17):  Normal coronary arteries.  Normal left ventricular systolic function, EF 71%, with upper normal LVEDP of 18 mmHg. LVEDP suggests mild diastolic dysfunction.  False positive myocardial perfusion imaging.  TTE (04/09/17):  LV normal in size with mild LVH. No wall motion abnormalities. Normal diastolic function. LA & RA normal in size. RV normal in size and function. No aortic stenosis or regurgitation. Aortic root normal in size. No mitral stenosis or regurgitation. Mild pulmonic regurgitation without stenosis. Mild tricuspid regurgitation. No pericardial effusion.    Assessment & Plan:  76 y.o. male with history of polymyalgia rheumatica responsive to prednisone therapy. Suspect the mild degree of ILD on his high-resolution CT scan is secondary to this previous autoimmune process. Even so, his spirometry and 6 minute walk test remains stable  by comparison today. He is continuing on CPAP therapy. We did discuss the lung nodule in his right upper lobe and we will repeat imaging at one year per recommendations. I instructed the patient to contact my office if he had any new breathing problems or questions before his next  appointment.  1. ILD: Repeat spirometry with DLCO and 6 minute walk test on room air at next appointment. Ordering autoimmune workup given history of polymyalgia rheumatica. 2. OSA: Continuing on CPAP therapy. Plan for download at next appointment. 3. Right upper lobe nodule: Plan for follow-up CT chest per guidelines and recommendations in September 2019. 4. Health maintenance:  Had Flu Vaccine this Fall.  5. Follow-up: Return to clinic in 3 months or sooner if needed.  Sonia Baller Ashok Cordia, M.D. [facility] Pager:  [phone] After 3pm or if no response, call [phone] 11:23 AM 10/23/17

## 2017-10-26 ENCOUNTER — Encounter: Payer: Self-pay | Admitting: Cardiology

## 2017-10-26 LAB — CENTROMERE ANTIBODIES: Centromere Ab Screen: <1 AI

## 2017-10-27 LAB — HISTONE ANTIBODIES, IGG, BLOOD: Histone Ab: <1 U (ref <1.0)

## 2017-10-27 LAB — ANTI-SCLERODERMA ANTIBODY: Scleroderma (Scl-70) (ENA) Antibody, IgG: <1 AI

## 2017-10-27 LAB — ANTI-SMITH ANTIBODY: ENA SM AB SER-ACNC: NEGATIVE AI

## 2017-10-27 LAB — CYCLIC CITRUL PEPTIDE ANTIBODY, IGG

## 2017-10-27 LAB — SJOGRENS SYNDROME-A EXTRACTABLE NUCLEAR ANTIBODY: SSA (Ro) (ENA) Antibody, IgG: <1 AI

## 2017-10-27 LAB — ANTI-DNA ANTIBODY, DOUBLE-STRANDED: ds DNA Ab: <1 IU/mL

## 2017-10-27 LAB — ANA: Anti Nuclear Antibody(ANA): NEGATIVE

## 2017-10-27 LAB — RHEUMATOID FACTOR

## 2017-11-03 LAB — MYOSITIS PANEL III
EJ*: NEGATIVE
Jo-1 (WB)*: NEGATIVE
Ku*: NEGATIVE
Mi-2 antibodies*: NEGATIVE
OJ: NEGATIVE
PL-12: NEGATIVE
PL-7: NEGATIVE
PM-Scl 100*: NEGATIVE
PM-Scl 75*: NEGATIVE
RNP: 1.9 U/mL
RO-52: NEGATIVE
SIGNAL RECOGNITION PARTICLE: NEGATIVE

## 2017-11-05 ENCOUNTER — Ambulatory Visit: Payer: Medicare Other | Admitting: Cardiology

## 2017-11-06 ENCOUNTER — Encounter: Payer: Self-pay | Admitting: Cardiology

## 2017-11-06 ENCOUNTER — Ambulatory Visit: Payer: Medicare Other | Admitting: Cardiology

## 2017-11-06 ENCOUNTER — Ambulatory Visit (INDEPENDENT_AMBULATORY_CARE_PROVIDER_SITE_OTHER): Payer: Medicare Other | Admitting: Cardiology

## 2017-11-06 VITALS — BP 118/58 | HR 66 | Ht 70.0 in | Wt 188.6 lb

## 2017-11-06 DIAGNOSIS — I2510 Atherosclerotic heart disease of native coronary artery without angina pectoris: Secondary | ICD-10-CM | POA: Diagnosis not present

## 2017-11-06 DIAGNOSIS — G4733 Obstructive sleep apnea (adult) (pediatric): Secondary | ICD-10-CM

## 2017-11-06 DIAGNOSIS — Z9989 Dependence on other enabling machines and devices: Secondary | ICD-10-CM | POA: Diagnosis not present

## 2017-11-06 NOTE — Patient Instructions (Signed)

## 2017-11-06 NOTE — Progress Notes (Signed)
Cardiology Office Note:    Date:  11/06/2017   ID:  Shane Cook, DOB Sep 26, 1941, MRN [id number]  PCP:  Lawerance Cruel, MD  Cardiologist:  Fransico Him, MD   Referring MD: Lawerance Cruel, MD   Chief Complaint  Patient presents with  . Sleep Apnea    History of Present Illness:    Shane Cook is a 76 y.o. male with a hx of exertional fatigue and witnessed apnea at night while sleeping and snoring.  He underwent PSG showing mild OSA with an AHI of 13.9/hr and oxygen desaturations as low as 83% and underwent CPAP titration to 9cm H2O.  He is doing well with his CPAP device and thinks that he has gotten used to it.  He started off on a small full face mask which he did not tolerate but his now on a nasal pillow mask and is tolerating it well.  He feels the pressure is adequate.  Since going on CPAP he feels rested in the am and feels less sleepiness during the day.  He occasionally has some mouthl dryness but does wear a chin strap.  He uses distilled water in his machine. His wife says that he no longer snores.     Past Medical History:  Diagnosis Date  . Allergic rhinitis   . Anemia   . Arthritis   . Asthma    no problems recently  . Colon polyp   . Decreased hearing   . Dupuytren contracture   . ED (erectile dysfunction)   . Edema    takes hydrochlorothiazide  . Heart murmur    hx yrs ago  . Hypercholesterolemia   . Hypertension   . Low testosterone   . Nephritis    76 yrs old  hospitalized. no problem since  . Peyronie's disease 06/2004  . Polymyalgia rheumatica (HCC)     Past Surgical History:  Procedure Laterality Date  . ACHILLES TENDON REPAIR Right   . CATARACT EXTRACTION     bilateral  . HERNIA REPAIR    . KNEE ARTHROSCOPY     left  . LEFT HEART CATH AND CORONARY ANGIOGRAPHY N/A 05/04/2017   Procedure: Left Heart Cath and Coronary Angiography;  Surgeon: Belva Crome, MD;  Location: [facility];  Service: Cardiovascular;  Laterality:  N/A;  . MOUTH SURGERY     cyst 76 yrs old ?ethmoid  . peyronies  08?   penis bent/  . ROTATOR CUFF REPAIR     bilat 20 years apart  . TONSILLECTOMY    . TOTAL KNEE ARTHROPLASTY Left 08/30/2013   Dr Rhona Raider  . TOTAL KNEE ARTHROPLASTY Left 08/30/2013   Procedure: TOTAL KNEE ARTHROPLASTY;  Surgeon: Hessie Dibble, MD;  Location: [facility];  Service: Orthopedics;  Laterality: Left;    Current Medications: Current Meds  Medication Sig  . acetaminophen (TYLENOL) 650 MG CR tablet Take 1,300 mg by mouth every 8 (eight) hours as needed for pain.  Marland Kitchen albuterol (PROVENTIL HFA;VENTOLIN HFA) 108 (90 BASE) MCG/ACT inhaler Inhale 2 puffs into the lungs every 4 (four) hours as needed for shortness of breath.   Marland Kitchen atorvastatin (LIPITOR) 40 MG tablet Take 20 mg by mouth daily.  . beta carotene w/minerals (OCUVITE) tablet Take 1 tablet by mouth daily.  . cetirizine (KLS ALLER-TEC) 10 MG tablet Take 10 mg by mouth daily.  . finasteride (PROSCAR) 5 MG tablet Take 5 mg by mouth daily.  . fluticasone (FLONASE) 50 MCG/ACT nasal spray  Place 2 sprays into the nose daily as needed for rhinitis or allergies.   . hydrochlorothiazide (HYDRODIURIL) 25 MG tablet Take 12.5 mg by mouth daily.   . Hypertonic Nasal Wash (SINUS RINSE NA) Place 1 Dose into the nose daily as needed (congestion).  Marland Kitchen levothyroxine (SYNTHROID, LEVOTHROID) 125 MCG tablet Take 125 mcg by mouth daily.   . mometasone (NASONEX) 50 MCG/ACT nasal spray Place 2 sprays into the nose 2 (two) times daily as needed (allergies).  Marland Kitchen testosterone cypionate (DEPO-TESTOSTERONE) 200 MG/ML injection Inject 100 mg into the muscle every Thursday.     Allergies:   Sulfa antibiotics and Amoxicillin   Social History   Socioeconomic History  . Marital status: Married    Spouse name: None  . Number of children: None  . Years of education: None  . Highest education level: None  Social Needs  . Financial resource strain: None  . Food insecurity - worry: None  .  Food insecurity - inability: None  . Transportation needs - medical: None  . Transportation needs - non-medical: None  Occupational History  . None  Tobacco Use  . Smoking status: Former Smoker    Packs/day: 2.00    Years: 21.00    Pack years: 42.00    Types: Cigarettes    Start date: 04/24/1954    Last attempt to quit: 05/28/1975    Years since quitting: 42.4  . Smokeless tobacco: Never Used  Substance and Sexual Activity  . Alcohol use: No  . Drug use: No  . Sexual activity: None    Comment: MARRIED  Other Topics Concern  . None  Social History Narrative   [facility] (06/03/17):   Originally from [facility]. He has only lived [place] & [place] only. 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 Has also traveled to [place]. Previously worked for [employer] in [employer]. 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 No known mold or asbestos exposure. 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 No pets currently. No bird exposure. No hot tub exposure.      Family History: The patient's family history includes Alzheimer's disease in his mother; Asthma in his grandchild; Emphysema in his father; Liver cancer in his father; Lung cancer in his father. There is no history of Colon cancer or Rheumatologic disease.  ROS:   Please see the history of present illness.    ROS  All other systems reviewed and negative.   EKGs/Labs/Other Studies Reviewed:    The following studies were reviewed today: CPAP download  EKG:  EKG is not ordered today.   Recent Labs: 04/27/2017: BUN 15; Creatinine, Ser 1.26; Hemoglobin 13.5; Platelets 231; Potassium 4.3; Sodium 146 05/25/2017: NT-Pro BNP 25   Recent Lipid Panel No results found for: CHOL, TRIG, HDL, CHOLHDL, VLDL, LDLCALC, LDLDIRECT  Physical Exam:    VS:  BP (!) 118/58   Pulse 66   Ht 5\' 10"  (1.778 m)   Wt 188 lb 9.6 oz (85.5 kg)    SpO2 99%   BMI 27.06 kg/m     Wt Readings from Last 3 Encounters:  11/06/17 188 lb 9.6 oz (85.5 kg)  10/23/17 187 lb (84.8 kg)  08/05/17 187 lb (84.8 kg)     GEN:  Well nourished, well developed in no acute distress HEENT: Normal NECK: No JVD; No carotid bruits LYMPHATICS: No lymphadenopathy CARDIAC: RRR, no murmurs, rubs, gallops RESPIRATORY:  Clear to auscultation without rales, wheezing or rhonchi  ABDOMEN: Soft, non-tender, non-distended MUSCULOSKELETAL:  No edema; No deformity  SKIN: Warm and dry NEUROLOGIC:  Alert and oriented x 3 PSYCHIATRIC:  Normal affect   ASSESSMENT:    1. OSA on CPAP    PLAN:    In order of problems listed above:  1.  OSA - the patient is tolerating PAP therapy well without any problems. The PAP download was reviewed today and showed an AHI of 0.5/hr on 9 cm H2O with 87% compliance in using more than 4 hours nightly.  The patient has been using and benefiting from CPAP use and will continue to benefit from therapy.      Medication Adjustments/Labs and Tests Ordered: Current medicines are reviewed at length with the patient today.  Concerns regarding medicines are outlined above.  No orders of the defined types were placed in this encounter.  No orders of the defined types were placed in this encounter.   Signed, Fransico Him, MD  11/06/2017 4:16 PM    [place]

## 2018-02-03 ENCOUNTER — Encounter: Payer: Self-pay | Admitting: Pulmonary Disease

## 2018-02-03 ENCOUNTER — Ambulatory Visit (INDEPENDENT_AMBULATORY_CARE_PROVIDER_SITE_OTHER): Payer: Medicare Other | Admitting: Pulmonary Disease

## 2018-02-03 VITALS — BP 124/66 | HR 56 | Ht 70.0 in | Wt 202.0 lb

## 2018-02-03 DIAGNOSIS — G4733 Obstructive sleep apnea (adult) (pediatric): Secondary | ICD-10-CM

## 2018-02-03 DIAGNOSIS — R911 Solitary pulmonary nodule: Secondary | ICD-10-CM | POA: Diagnosis not present

## 2018-02-03 DIAGNOSIS — J849 Interstitial pulmonary disease, unspecified: Secondary | ICD-10-CM

## 2018-02-03 DIAGNOSIS — R918 Other nonspecific abnormal finding of lung field: Secondary | ICD-10-CM

## 2018-02-03 DIAGNOSIS — Z9989 Dependence on other enabling machines and devices: Secondary | ICD-10-CM | POA: Diagnosis not present

## 2018-02-03 DIAGNOSIS — IMO0001 Reserved for inherently not codable concepts without codable children: Secondary | ICD-10-CM

## 2018-02-03 LAB — PULMONARY FUNCTION TEST
DL/VA % PRED: 110 %
DL/VA: 5.05 ml/min/mmHg/L
DLCO UNC % PRED: 71 %
DLCO UNC: 23.19 ml/min/mmHg
FEF 25-75 POST: 2.29 L/s
FEF 25-75 PRE: 2.21 L/s
FEF2575-%Change-Post: 3 %
FEF2575-%PRED-PRE: 105 %
FEF2575-%Pred-Post: 109 %
FEV1-%Change-Post: 0 %
FEV1-%PRED-PRE: 79 %
FEV1-%Pred-Post: 78 %
FEV1-Post: 2.34 L
FEV1-Pre: 2.35 L
FEV1FVC-%Change-Post: 3 %
FEV1FVC-%Pred-Pre: 112 %
FEV6-%CHANGE-POST: -3 %
FEV6-%PRED-POST: 72 %
FEV6-%Pred-Pre: 75 %
FEV6-POST: 2.81 L
FEV6-Pre: 2.91 L
FEV6FVC-%PRED-POST: 106 %
FEV6FVC-%Pred-Pre: 106 %
FVC-%Change-Post: -3 %
FVC-%PRED-POST: 68 %
FVC-%Pred-Pre: 70 %
FVC-POST: 2.81 L
FVC-PRE: 2.91 L
PRE FEV1/FVC RATIO: 81 %
Post FEV1/FVC ratio: 83 %
Post FEV6/FVC ratio: 100 %
Pre FEV6/FVC Ratio: 100 %
RV % pred: 66 %
RV: 1.72 L
TLC % PRED: 64 %
TLC: 4.52 L

## 2018-02-03 NOTE — Patient Instructions (Signed)
Interstitial lung disease: At this time I see no evidence that this has progressed nor is it very severe We will continue to monitor with another lung function test in a year  Pulmonary nodule: We will arrange for a CT scan of your chest in September 2019  Obstructive sleep apnea:  Keep using CPAP every night  Follow-up with me in September 2019

## 2018-02-03 NOTE — Progress Notes (Signed)
Subjective:   PATIENT ID: Shane Cook GENDER: male DOB: 11-02-1941, MRN: [id number]  Synopsis: Former patient of Dr. Ashok Cordia who has interstitial lung disease, obstructive sleep apnea and a right upper lobe nodule.  He also has a past medical history significant for polymyalgia rheumatica.  HPI  Chief Complaint  Patient presents with  . Follow-up    former [facility] pt being treated for ILD, DOE.      [name] says that since the last visit he has not had much trouble breathing.   He says that he doesn't cut as many lawns as he used to, but this he he doesn't think is due to any problem breathing.  He says that he has not had any cough, chest tightness or chest pain.  He had blood work on the last visit.    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   Past Medical History:  Diagnosis Date  . Allergic rhinitis   . Anemia   . Arthritis   . Asthma    no problems recently  . Colon polyp   . Decreased hearing   . Dupuytren contracture   . ED (erectile dysfunction)   . Edema    takes hydrochlorothiazide  . Heart murmur    hx yrs ago  . Hypercholesterolemia   . Hypertension   . Low testosterone   . Nephritis    77 yrs old  hospitalized. no problem since  . Peyronie's disease 06/2004  . Polymyalgia rheumatica (HCC)      Family History  Problem Relation Age of Onset  . Alzheimer's disease Mother   . Lung cancer Father   . Emphysema Father   . Liver cancer Father   . Asthma Grandchild   . Colon cancer Neg Hx   . Rheumatologic disease Neg Hx      Social History   Socioeconomic History  . Marital status: Married    Spouse name: Not on file  . Number of children: Not on file  . Years of education: Not on file  . Highest education level: Not on file  Social Needs  . Financial resource strain: Not on file  . Food insecurity - worry:  Not on file  . Food insecurity - inability: Not on file  . Transportation needs - medical: Not on file  . Transportation needs - non-medical: Not on file  Occupational History  . Not on file  Tobacco Use  . Smoking status: Former Smoker    Packs/day: 2.00    Years: 21.00    Pack years: 42.00    Types: Cigarettes    Start date: 04/24/1954    Last attempt to quit: 05/28/1975    Years since quitting: 42.7  . Smokeless tobacco: Never Used  Substance and Sexual Activity  . Alcohol use: No  . Drug use: No  . Sexual activity: Not on file    Comment: MARRIED  Other Topics Concern  . Not on file  Social History Narrative   [facility] (06/03/17):   Originally from [facility]. He has only lived [place] & [place] only. 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 Has also traveled to [place]. Previously worked for [employer] in [employer]. 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 No known mold or asbestos exposure. 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 No pets currently. No bird exposure. No hot tub exposure.      Allergies  Allergen Reactions  . Sulfa Antibiotics     Questionable allergy  . Amoxicillin Rash    Has patient had a PCN reaction causing immediate rash, facial/tongue/throat swelling, SOB or lightheadedness with hypotension: Yes Has patient had a PCN reaction causing severe rash involving mucus membranes or skin necrosis: No Has patient had a PCN reaction that required hospitalization: No Has patient had a PCN reaction occurring within the last 10 years: No If all of the above answers are "NO", then may proceed with Cephalosporin use.      Outpatient Medications Prior to Visit  Medication Sig Dispense Refill  . acetaminophen (TYLENOL) 650 MG CR tablet Take 1,300 mg by mouth every 8 (eight) hours as needed for pain.    Marland Kitchen albuterol (PROVENTIL HFA;VENTOLIN HFA)  108 (90 BASE) MCG/ACT inhaler Inhale 2 puffs into the lungs every 4 (four) hours as needed for shortness of breath.     Marland Kitchen atorvastatin (LIPITOR) 40 MG tablet Take 20 mg by mouth daily.    . beta carotene w/minerals (OCUVITE) tablet Take 1 tablet by mouth daily.    . cetirizine (KLS ALLER-TEC) 10 MG tablet Take 10 mg by mouth daily.    . finasteride (PROSCAR) 5 MG tablet Take 5 mg by mouth daily.    . fluticasone (FLONASE) 50 MCG/ACT nasal spray Place 2 sprays into the nose daily as needed for rhinitis or allergies.     . hydrochlorothiazide (HYDRODIURIL) 25 MG tablet Take 12.5 mg by mouth daily.     . Hypertonic Nasal Wash (SINUS RINSE NA) Place 1 Dose into the nose daily as needed (congestion).    Marland Kitchen levothyroxine (SYNTHROID, LEVOTHROID) 125 MCG tablet Take 125 mcg by mouth daily.     . mometasone (NASONEX) 50 MCG/ACT nasal spray Place 2 sprays into the nose 2 (two) times daily as needed (allergies).    Marland Kitchen testosterone cypionate (DEPO-TESTOSTERONE) 200 MG/ML injection Inject 100 mg into the muscle every Thursday.     No facility-administered medications prior to visit.     ROS    Objective:  Physical Exam   Vitals:   02/03/18 1629  BP: 124/66  Pulse: (!) 56  SpO2: 98%  Weight: 202 lb (91.6 kg)  Height: 5\' 10"  (1.778 m)    Gen: well appearing HENT: OP clear, TM's clear, neck supple PULM: CTA B, normal percussion CV: RRR, no mgr, trace edema GI: BS+, soft, nontender Derm: no cyanosis or rash Psyche: normal mood and affect  CBC    Component Value Date/Time   WBC 6.7 04/27/2017 1031   WBC 8.2 11/12/2014 1848   RBC 4.46 04/27/2017 1031   RBC 5.44 11/12/2014 1848   HGB 13.5 04/27/2017 1031   HGB 13.9 10/07/2007 1536   HCT 40.8 04/27/2017 1031   HCT 40.6 10/07/2007 1536   PLT 231 04/27/2017 1031   MCV 92 04/27/2017 1031   MCV 88.8 10/07/2007 1536   MCH 30.3 04/27/2017 1031   MCH 30.9 11/12/2014 1848   MCHC 33.1 04/27/2017 1031   MCHC 33.1 11/12/2014 1848   RDW 14.7  04/27/2017 1031   RDW 13.3 10/07/2007 1536   LYMPHSABS 1.5 04/27/2017 1031   LYMPHSABS 1.8 10/07/2007 1536   MONOABS 0.8 11/12/2014 1848   MONOABS 0.4 10/07/2007 1536   EOSABS 0.2 04/27/2017 1031   BASOSABS 0.0 04/27/2017 1031  BASOSABS 0.2 (H) 10/07/2007 1536     PFT 10/23/17: FVC 3.00 L (32%) FEV1 2.30 L (77%) FEV1/FVC 0.77 FEF 25-75 1.86 L (88%)                                                                                                                             DLCO corrected 66% 07/22/17: FVC 3.02 L (72%) FEV1 2.21 L (74%) FEV1/FVC 0.73 FEF 25-75 1.57 L (74%) negative bronchodilator response TLC 3.90 L (55%) RV 55% ERV 120% DLCO corrected 78%  6MWT 10/22/17:  Walked 288 meters / Baseline Sat 100% on RA / Nadir Sat 99% on RA @ end of test 07/23/17:  Walked 304 meters / Baseline Sat 99% on RA / Nadir Sat 98% on RA @ end of test  CPAP TITRATION (08/05/17):  Recommended trial of CPAP at 9 cm H2O with a Medium size Resmed Full Face Mask AirFit F20 mask and heated humidification.  POLYSOMNOGRAM (07/03/17): IMPRESSIONS - Mild obstructive sleep apnea occurred during this study (AHI = 13.9/h). - No significant central sleep apnea occurred during this study (CAI = 0.2/h). - Mild oxygen desaturation was noted during this study (Min O2 = 83.00%). - The patient snored with Moderate snoring volume. - No cardiac abnormalities were noted during this study. - Clinically significant periodic limb movements did not occur during sleep. No significant associated arousals.  IMAGING HRCT CHEST W/O 07/30/17 (personally reviewed by me):   Nonspecific reticulation in the bases and periphery of both lungs bilaterally  CXR PA/LAT 03/04/17 (Previously reviewed by me):  Questionable scoring/linear opacification left lung base. No peripheral consolidation or mass appreciated. No pleural effusion. Heart normal in size & mediastinum normal in contour.  CARDIAC LHC (05/04/17):  Normal coronary  arteries.  Normal left ventricular systolic function, EF 28%, with upper normal LVEDP of 18 mmHg. LVEDP suggests mild diastolic dysfunction.  False positive myocardial perfusion imaging.  TTE (04/09/17):  LV normal in size with mild LVH. No wall motion abnormalities. Normal diastolic function. LA & RA normal in size. RV normal in size and function. No aortic stenosis or regurgitation. Aortic root normal in size. No mitral stenosis or regurgitation. Mild pulmonic regurgitation without stenosis. Mild tricuspid regurgitation. No pericardial effusion.  Labs: November 2018 ANA negative, centromere negative, CCP negative, double-stranded DNA negative, extended spectrum myositis panel negative, Jo 1-, rheumatoid factor negative, RNP negative, ENA negative, SSA/SSB negative, SCL 70-      Assessment & Plan:   ILD (interstitial lung disease) ([place])  Abnormal findings on diagnostic imaging of lung - Plan: CT Chest Wo Contrast  OSA on CPAP  Lung nodule < 6cm on CT  Discussion: Mr. mazzola is here today to follow-up for an ill-defined interstitial lung disease.  I reviewed the images from the CT scan myself and looked at the lung function testing.  There is really only mild findings on both of these and nothing that would suggest an underlying condition like usual interstitial  pneumonitis.  Further, there is been no progression on lung function testing.  The exact cause of this interstitial lung disease is uncertain and would only be diagnosed with an open lung biopsy but given his lack of symptoms and the mild findings this is not warranted.  Interstitial lung disease: At this time I see no evidence that this has progressed nor is it very severe We will continue to monitor with another lung function test in a year  Pulmonary nodule: We will arrange for a CT scan of your chest in September 2019  Obstructive sleep apnea:  Keep using CPAP every night  Follow-up with me in September  2019  Greater than 50% of this 28-minute visit face-to-face    Current Outpatient Medications:  .  acetaminophen (TYLENOL) 650 MG CR tablet, Take 1,300 mg by mouth every 8 (eight) hours as needed for pain., Disp: , Rfl:  .  albuterol (PROVENTIL HFA;VENTOLIN HFA) 108 (90 BASE) MCG/ACT inhaler, Inhale 2 puffs into the lungs every 4 (four) hours as needed for shortness of breath. , Disp: , Rfl:  .  atorvastatin (LIPITOR) 40 MG tablet, Take 20 mg by mouth daily., Disp: , Rfl:  .  beta carotene w/minerals (OCUVITE) tablet, Take 1 tablet by mouth daily., Disp: , Rfl:  .  cetirizine (KLS ALLER-TEC) 10 MG tablet, Take 10 mg by mouth daily., Disp: , Rfl:  .  finasteride (PROSCAR) 5 MG tablet, Take 5 mg by mouth daily., Disp: , Rfl:  .  fluticasone (FLONASE) 50 MCG/ACT nasal spray, Place 2 sprays into the nose daily as needed for rhinitis or allergies. , Disp: , Rfl:  .  hydrochlorothiazide (HYDRODIURIL) 25 MG tablet, Take 12.5 mg by mouth daily. , Disp: , Rfl:  .  Hypertonic Nasal Wash (SINUS RINSE NA), Place 1 Dose into the nose daily as needed (congestion)., Disp: , Rfl:  .  levothyroxine (SYNTHROID, LEVOTHROID) 125 MCG tablet, Take 125 mcg by mouth daily. , Disp: , Rfl:  .  mometasone (NASONEX) 50 MCG/ACT nasal spray, Place 2 sprays into the nose 2 (two) times daily as needed (allergies)., Disp: , Rfl:

## 2018-02-03 NOTE — Progress Notes (Signed)
PFT completed today 02/03/18  

## 2018-05-13 DIAGNOSIS — H353133 Nonexudative age-related macular degeneration, bilateral, advanced atrophic without subfoveal involvement: Secondary | ICD-10-CM | POA: Diagnosis not present

## 2018-05-13 DIAGNOSIS — H43813 Vitreous degeneration, bilateral: Secondary | ICD-10-CM | POA: Diagnosis not present

## 2018-05-13 DIAGNOSIS — H35371 Puckering of macula, right eye: Secondary | ICD-10-CM | POA: Diagnosis not present

## 2018-05-13 DIAGNOSIS — H3509 Other intraretinal microvascular abnormalities: Secondary | ICD-10-CM | POA: Diagnosis not present

## 2018-05-17 DIAGNOSIS — K635 Polyp of colon: Secondary | ICD-10-CM | POA: Diagnosis not present

## 2018-05-17 DIAGNOSIS — I10 Essential (primary) hypertension: Secondary | ICD-10-CM | POA: Diagnosis not present

## 2018-05-17 DIAGNOSIS — Z0000 Encounter for general adult medical examination without abnormal findings: Secondary | ICD-10-CM | POA: Diagnosis not present

## 2018-05-17 DIAGNOSIS — E039 Hypothyroidism, unspecified: Secondary | ICD-10-CM | POA: Diagnosis not present

## 2018-05-17 DIAGNOSIS — R5383 Other fatigue: Secondary | ICD-10-CM | POA: Diagnosis not present

## 2018-05-17 DIAGNOSIS — J449 Chronic obstructive pulmonary disease, unspecified: Secondary | ICD-10-CM | POA: Diagnosis not present

## 2018-05-17 DIAGNOSIS — E7800 Pure hypercholesterolemia, unspecified: Secondary | ICD-10-CM | POA: Diagnosis not present

## 2018-05-17 DIAGNOSIS — Z79899 Other long term (current) drug therapy: Secondary | ICD-10-CM | POA: Diagnosis not present

## 2018-05-17 DIAGNOSIS — D649 Anemia, unspecified: Secondary | ICD-10-CM | POA: Diagnosis not present

## 2018-05-17 DIAGNOSIS — Z1331 Encounter for screening for depression: Secondary | ICD-10-CM | POA: Diagnosis not present

## 2018-07-05 DIAGNOSIS — N401 Enlarged prostate with lower urinary tract symptoms: Secondary | ICD-10-CM | POA: Diagnosis not present

## 2018-07-05 DIAGNOSIS — E291 Testicular hypofunction: Secondary | ICD-10-CM | POA: Diagnosis not present

## 2018-07-06 ENCOUNTER — Encounter: Payer: Self-pay | Admitting: Gastroenterology

## 2018-07-12 DIAGNOSIS — E291 Testicular hypofunction: Secondary | ICD-10-CM | POA: Diagnosis not present

## 2018-07-12 DIAGNOSIS — N401 Enlarged prostate with lower urinary tract symptoms: Secondary | ICD-10-CM | POA: Diagnosis not present

## 2018-07-12 DIAGNOSIS — R3914 Feeling of incomplete bladder emptying: Secondary | ICD-10-CM | POA: Diagnosis not present

## 2018-07-12 DIAGNOSIS — N5201 Erectile dysfunction due to arterial insufficiency: Secondary | ICD-10-CM | POA: Diagnosis not present

## 2018-08-02 ENCOUNTER — Ambulatory Visit (INDEPENDENT_AMBULATORY_CARE_PROVIDER_SITE_OTHER)
Admission: RE | Admit: 2018-08-02 | Discharge: 2018-08-02 | Disposition: A | Discharge status: Home / Self Care | Payer: Medicare Other | Source: Ambulatory Visit | Attending: Pulmonary Disease | Admitting: Pulmonary Disease

## 2018-08-02 DIAGNOSIS — R918 Other nonspecific abnormal finding of lung field: Secondary | ICD-10-CM

## 2018-08-04 ENCOUNTER — Telehealth: Payer: Self-pay | Admitting: Pulmonary Disease

## 2018-08-04 NOTE — Telephone Encounter (Signed)
Called and spoke with patient, advised of results. Patient verbalized understanding. Nothing further needed.

## 2018-08-12 ENCOUNTER — Ambulatory Visit (INDEPENDENT_AMBULATORY_CARE_PROVIDER_SITE_OTHER): Payer: Medicare Other | Admitting: Pulmonary Disease

## 2018-08-12 ENCOUNTER — Encounter: Payer: Self-pay | Admitting: Pulmonary Disease

## 2018-08-12 VITALS — BP 124/70 | HR 73 | Ht 69.69 in | Wt 207.6 lb

## 2018-08-12 DIAGNOSIS — J849 Interstitial pulmonary disease, unspecified: Secondary | ICD-10-CM | POA: Diagnosis not present

## 2018-08-12 DIAGNOSIS — R911 Solitary pulmonary nodule: Secondary | ICD-10-CM | POA: Diagnosis not present

## 2018-08-12 DIAGNOSIS — R918 Other nonspecific abnormal finding of lung field: Secondary | ICD-10-CM

## 2018-08-12 DIAGNOSIS — G4733 Obstructive sleep apnea (adult) (pediatric): Secondary | ICD-10-CM

## 2018-08-12 DIAGNOSIS — IMO0001 Reserved for inherently not codable concepts without codable children: Secondary | ICD-10-CM

## 2018-08-12 NOTE — Progress Notes (Addendum)
Subjective:   PATIENT ID: Shane Cook GENDER: male DOB: 1941-07-15, MRN: [id number]  Synopsis: Former patient of Dr. Ashok Cordia who has interstitial lung disease, obstructive sleep apnea and a right upper lobe nodule.  He also has a past medical history significant for polymyalgia rheumatica.  HPI  Chief Complaint  Patient presents with  . Follow-up   Jenny Reichmann has been doing about the same in terms of breathing since last visit.  He still cuts the grass but he says it takes a few days.  He says that whenever he physically exerts himself he feels some shortness of breath.  He is here today to follow-up on the results of a pulmonary nodule.  He says that he has had a cold for the last week and he has been taking some over-the-counter acetaminophen/phenylephrine/dextromethorphan commendation tablets for this.  He says that he is getting over it.  Past Medical History:  Diagnosis Date  . Allergic rhinitis   . Anemia   . Arthritis   . Asthma    no problems recently  . Colon polyp   . Decreased hearing   . Dupuytren contracture   . ED (erectile dysfunction)   . Edema    takes hydrochlorothiazide  . Heart murmur    hx yrs ago  . Hypercholesterolemia   . Hypertension   . Low testosterone   . Nephritis    77 yrs old  hospitalized. no problem since  . Peyronie's disease 06/2004  . Polymyalgia rheumatica (HCC)      ROS    Objective:  Physical Exam   Vitals:   08/12/18 0927  BP: 124/70  Pulse: 73  SpO2: 97%  Weight: 207 lb 9.6 oz (94.2 kg)  Height: 5' 9.69" (1.77 m)    Gen: well appearing HENT: OP clear, TM's clear, neck supple PULM: CTA B, normal percussion CV: RRR, no mgr, trace edema GI: BS+, soft, nontender Derm: no cyanosis or rash Psyche: normal mood and affect   CBC    Component Value Date/Time   WBC 6.7 04/27/2017 1031   WBC 8.2 11/12/2014 1848   RBC 4.46 04/27/2017 1031   RBC 5.44 11/12/2014 1848   HGB 13.5 04/27/2017 1031   HGB 13.9 10/07/2007  1536   HCT 40.8 04/27/2017 1031   HCT 40.6 10/07/2007 1536   PLT 231 04/27/2017 1031   MCV 92 04/27/2017 1031   MCV 88.8 10/07/2007 1536   MCH 30.3 04/27/2017 1031   MCH 30.9 11/12/2014 1848   MCHC 33.1 04/27/2017 1031   MCHC 33.1 11/12/2014 1848   RDW 14.7 04/27/2017 1031   RDW 13.3 10/07/2007 1536   LYMPHSABS 1.5 04/27/2017 1031   LYMPHSABS 1.8 10/07/2007 1536   MONOABS 0.8 11/12/2014 1848   MONOABS 0.4 10/07/2007 1536   EOSABS 0.2 04/27/2017 1031   BASOSABS 0.0 04/27/2017 1031   BASOSABS 0.2 (H) 10/07/2007 1536     PFT 10/23/17: FVC 3.00 L (32%) FEV1 2.30 L (77%) FEV1/FVC 0.77 FEF 25-75 1.86 L (88%)  DLCO corrected 66% 07/22/17: FVC 3.02 L (72%) FEV1 2.21 L (74%) FEV1/FVC 0.73 FEF 25-75 1.57 L (74%) negative bronchodilator response TLC 3.90 L (55%) RV 55% ERV 120% DLCO corrected 78%  6MWT 10/22/17:  Walked 288 meters / Baseline Sat 100% on RA / Nadir Sat 99% on RA @ end of test 07/23/17:  Walked 304 meters / Baseline Sat 99% on RA / Nadir Sat 98% on RA @ end of test  CPAP TITRATION (08/05/17):  Recommended trial of CPAP at 9 cm H2O with a Medium size Resmed Full Face Mask AirFit F20 mask and heated humidification.  POLYSOMNOGRAM (07/03/17): IMPRESSIONS - Mild obstructive sleep apnea occurred during this study (AHI = 13.9/h). - No significant central sleep apnea occurred during this study (CAI = 0.2/h). - Mild oxygen desaturation was noted during this study (Min O2 = 83.00%). - The patient snored with Moderate snoring volume. - No cardiac abnormalities were noted during this study. - Clinically significant periodic limb movements did not occur during sleep. No significant associated arousals.  IMAGING HRCT CHEST W/O 07/30/17 (personally reviewed by me):   Nonspecific reticulation in the bases and periphery of both lungs bilaterally  CXR PA/LAT 03/04/17 (Previously reviewed by me):  Questionable scoring/linear opacification left lung base. No peripheral consolidation or  mass appreciated. No pleural effusion. Heart normal in size & mediastinum normal in contour.  CARDIAC LHC (05/04/17):  Normal coronary arteries.  Normal left ventricular systolic function, EF 81%, with upper normal LVEDP of 18 mmHg. LVEDP suggests mild diastolic dysfunction.  False positive myocardial perfusion imaging.  TTE (04/09/17):  LV normal in size with mild LVH. No wall motion abnormalities. Normal diastolic function. LA & RA normal in size. RV normal in size and function. No aortic stenosis or regurgitation. Aortic root normal in size. No mitral stenosis or regurgitation. Mild pulmonic regurgitation without stenosis. Mild tricuspid regurgitation. No pericardial effusion.  Labs: November 2018 ANA negative, centromere negative, CCP negative, double-stranded DNA negative, extended spectrum myositis panel negative, Jo 1-, rheumatoid factor negative, RNP negative, ENA negative, SSA/SSB negative, SCL 70 negative      Assessment & Plan:   ILD (interstitial lung disease) ([place]) - Plan: Pulmonary function test  OSA (obstructive sleep apnea) - Plan: Ambulatory Referral for DME  Abnormal findings on diagnostic imaging of lung  Lung nodule < 6cm on CT  Discussion: Jenny Reichmann has no evidence of worsening pulmonary nodules.  At this time there is no indication for repeat imaging.  He has very mild findings of nonspecific fibrosis of undetermined etiology.  As described previously given his lack of symptoms and only minimal findings on CT imaging there is no way I would actually help him by doing a biopsy.  In fact I would cause more harm than good.  Plan: Interstitial lung disease. This is the medical term to describe the fact that you have scarring in your lungs, as we described today and on the last visit I do not know the exact type of disease but the findings are quite mild on both lung function testing and CT imaging.  I would not help you by performing an open lung biopsy.  We will  continue to monitor this with serial lung function testing.  The next will be in March 2020.  Pulmonary nodules: These did not change in size so you do not need to have further imaging  Obstructive sleep apnea: Keep using CPAP nightly We will request a download of a compliance report  Health maintenance: You need to get a high-dose flu shot when you are over this cold  17 minutes spent with patient, 25 minute visit  Current Outpatient Medications:  .  acetaminophen (TYLENOL) 650 MG CR tablet, Take 1,300 mg by mouth every 8 (eight) hours as needed for pain., Disp: , Rfl:  .  albuterol (PROVENTIL HFA;VENTOLIN HFA) 108 (90 BASE) MCG/ACT inhaler, Inhale 2 puffs into the lungs every 4 (four) hours as needed for shortness of breath. , Disp: ,  Rfl:  .  atorvastatin (LIPITOR) 40 MG tablet, Take 20 mg by mouth daily., Disp: , Rfl:  .  beta carotene w/minerals (OCUVITE) tablet, Take 1 tablet by mouth daily., Disp: , Rfl:  .  cetirizine (KLS ALLER-TEC) 10 MG tablet, Take 10 mg by mouth daily., Disp: , Rfl:  .  fluticasone (FLONASE) 50 MCG/ACT nasal spray, Place 2 sprays into the nose daily as needed for rhinitis or allergies. , Disp: , Rfl:  .  hydrochlorothiazide (HYDRODIURIL) 25 MG tablet, Take 12.5 mg by mouth daily. , Disp: , Rfl:  .  Hypertonic Nasal Wash (SINUS RINSE NA), Place 1 Dose into the nose daily as needed (congestion)., Disp: , Rfl:  .  levothyroxine (SYNTHROID, LEVOTHROID) 125 MCG tablet, Take 125 mcg by mouth daily. , Disp: , Rfl:  .  mometasone (NASONEX) 50 MCG/ACT nasal spray, Place 2 sprays into the nose 2 (two) times daily as needed (allergies)., Disp: , Rfl:

## 2018-08-12 NOTE — Patient Instructions (Signed)
Pulmonary nodules: These did not change in size so you do not need to have further imaging  Obstructive sleep apnea: Keep using CPAP nightly We will request a download of a compliance report  Health maintenance: You need to get a high-dose flu shot when you are over this cold  17 minutes spent with patient, 25 minute visit

## 2018-08-27 ENCOUNTER — Encounter: Payer: Self-pay | Admitting: Gastroenterology

## 2018-08-27 ENCOUNTER — Ambulatory Visit (INDEPENDENT_AMBULATORY_CARE_PROVIDER_SITE_OTHER): Payer: Medicare Other | Admitting: Gastroenterology

## 2018-08-27 VITALS — BP 128/72 | HR 64 | Ht 69.5 in | Wt 206.5 lb

## 2018-08-27 DIAGNOSIS — Z86010 Personal history of colonic polyps: Secondary | ICD-10-CM | POA: Diagnosis not present

## 2018-08-27 NOTE — Patient Instructions (Addendum)
Call if you have concerning GI symptoms (bleeding, changes in bowels, unexplained weight loss, abd pains).  Normal BMI (Body Mass Index- based on height and weight) is between 23 and 30. Your BMI today is Body mass index is 30.06 kg/m. Marland Kitchen Please consider follow up  regarding your BMI with your Primary Care Provider.  Thank you for entrusting me with your care and choosing [facility].  Dr Ardis Hughs

## 2018-08-27 NOTE — Progress Notes (Signed)
HPI: This is a very pleasant 77 year old man who was referred to me by Lawerance Cruel, MD  to evaluate history of colon polyps.    Chief complaint is history of colon polyps   Colonoscopy Dr. Inocente Salles 08/12/1992 handwritten report, I cannot understand the writing.  Pictures show a small polyp was cauterized.  Pathology report from that procedure hyperplastic polyp.  Colonoscopy Dr. Inocente Salles 01/1994, handwritten report, I cannot understand the handwriting or findings, he recommended follow-up at 3 years.  No pathology associated.  Colonoscopy, Dr. Inocente Salles 01/1998, "a 3 to 4 mm polyp was cauterized to destruction" no pathology.  Colonoscopy, Dr. Inocente Salles 06/2001, a single 4 mm polyp was removed with hot biopsy, this was a tubular adenoma on pathology.  Colonoscopy, Dr. Inocente Salles 11/2005 for "surveillance of adenomatous polyps" hot biopsy of 2 polyps was performed.  No pathology sent.  Colonoscopy, Dr. Ardis Hughs 08/2012 diverticulosis only  He has no issues with his bowels.  No changes, no bleeding, no significant abdominal pains.  No unexplained weight loss.  He really feels fine.  His father had lung to liver cancer  No family history of colon cancer that he is aware of    Review of systems: Pertinent positive and negative review of systems were noted in the above HPI section. All other review negative.   Past Medical History:  Diagnosis Date  . Allergic rhinitis   . Anemia   . Arthritis   . Asthma    no problems recently  . Colon polyp   . Decreased hearing   . Dupuytren contracture   . ED (erectile dysfunction)   . Edema    takes hydrochlorothiazide  . Heart murmur    hx yrs ago  . Hypercholesterolemia   . Hypertension   . Low testosterone   . Nephritis    77 yrs old  hospitalized. no problem since  . Peyronie's disease 06/2004  . Polymyalgia rheumatica (HCC)     Past Surgical History:  Procedure Laterality Date  . ACHILLES TENDON REPAIR Right   . CATARACT EXTRACTION     bilateral   . HERNIA REPAIR    . KNEE ARTHROSCOPY     left  . LEFT HEART CATH AND CORONARY ANGIOGRAPHY N/A 05/04/2017   Procedure: Left Heart Cath and Coronary Angiography;  Surgeon: Belva Crome, MD;  Location: [facility];  Service: Cardiovascular;  Laterality: N/A;  . MOUTH SURGERY     cyst 77 yrs old ?ethmoid  . peyronies  08?   penis bent/  . ROTATOR CUFF REPAIR     bilat 20 years apart  . TONSILLECTOMY    . TOTAL KNEE ARTHROPLASTY Left 08/30/2013   Dr Rhona Raider  . TOTAL KNEE ARTHROPLASTY Left 08/30/2013   Procedure: TOTAL KNEE ARTHROPLASTY;  Surgeon: Hessie Dibble, MD;  Location: [facility];  Service: Orthopedics;  Laterality: Left;    Current Outpatient Medications  Medication Sig Dispense Refill  . acetaminophen (TYLENOL) 650 MG CR tablet Take 1,300 mg by mouth every 8 (eight) hours as needed for pain.    Marland Kitchen albuterol (PROVENTIL HFA;VENTOLIN HFA) 108 (90 BASE) MCG/ACT inhaler Inhale 2 puffs into the lungs every 4 (four) hours as needed for shortness of breath.     Marland Kitchen atorvastatin (LIPITOR) 40 MG tablet Take 20 mg by mouth daily.    . beta carotene w/minerals (OCUVITE) tablet Take 1 tablet by mouth daily.    . cetirizine (KLS ALLER-TEC) 10 MG tablet Take 10 mg by  mouth daily.    . fluticasone (FLONASE) 50 MCG/ACT nasal spray Place 2 sprays into the nose daily as needed for rhinitis or allergies.     . hydrochlorothiazide (HYDRODIURIL) 25 MG tablet Take 12.5 mg by mouth daily.     . Hypertonic Nasal Wash (SINUS RINSE NA) Place 1 Dose into the nose daily as needed (congestion).    Marland Kitchen levothyroxine (SYNTHROID, LEVOTHROID) 125 MCG tablet Take 125 mcg by mouth daily.     . mometasone (NASONEX) 50 MCG/ACT nasal spray Place 2 sprays into the nose 2 (two) times daily as needed (allergies).     No current facility-administered medications for this visit.     Allergies as of 08/27/2018 - Review Complete 08/27/2018  Allergen Reaction Noted  . Sulfa antibiotics  09/03/2012  . Amoxicillin Rash  08/23/2013    Family History  Problem Relation Age of Onset  . Alzheimer's disease Mother   . Lung cancer Father   . Emphysema Father   . Liver cancer Father   . Asthma Grandchild   . Colon cancer Neg Hx   . Rheumatologic disease Neg Hx     Social History   Socioeconomic History  . Marital status: Married    Spouse name: Not on file  . Number of children: 2  . Years of education: Not on file  . Highest education level: Not on file  Occupational History  . Occupation: Press photographer  Social Needs  . Financial resource strain: Not on file  . Food insecurity:    Worry: Not on file    Inability: Not on file  . Transportation needs:    Medical: Not on file    Non-medical: Not on file  Tobacco Use  . Smoking status: Former Smoker    Packs/day: 2.00    Years: 21.00    Pack years: 42.00    Types: Cigarettes    Start date: 04/24/1954    Last attempt to quit: 05/28/1975    Years since quitting: 43.2  . Smokeless tobacco: Never Used  Substance and Sexual Activity  . Alcohol use: No  . Drug use: No  . Sexual activity: Not on file    Comment: MARRIED  Lifestyle  . Physical activity:    Days per week: Not on file    Minutes per session: Not on file  . Stress: Not on file  Relationships  . Social connections:    Talks on phone: Not on file    Gets together: Not on file    Attends religious service: Not on file    Active member of club or organization: Not on file    Attends meetings of clubs or organizations: Not on file    Relationship status: Not on file  . Intimate partner violence:    Fear of current or ex partner: Not on file    Emotionally abused: Not on file    Physically abused: Not on file    Forced sexual activity: Not on file  Other Topics Concern  . Not on file  Social History Narrative    Pulmonary (06/03/17):   Originally from [facility]. He has only lived  & [place] only. 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 Has also traveled to [place].  Previously worked for [employer] in [employer]. 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 No known mold or asbestos exposure. 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 No pets currently. No bird exposure. No hot tub exposure.      Physical Exam: BP 128/72   Pulse 64   Ht 5' 9.5" (1.765 m)   Wt 206 lb 8 oz (93.7 kg)   BMI 30.06 kg/m  Constitutional: generally well-appearing Psychiatric: alert and oriented x3 Eyes: extraocular movements intact Mouth: oral pharynx moist, no lesions Neck: supple no lymphadenopathy Cardiovascular: heart regular rate and rhythm Lungs: clear to auscultation bilaterally Abdomen: soft, nontender, nondistended, no obvious ascites, no peritoneal signs, normal bowel sounds Extremities: no lower extremity edema bilaterally Skin: no lesions on visible extremities   Assessment and plan: 77 y.o. male with personal history of adenomatous polyps  I reviewed all of his previous 6 or 7 colonoscopies.  See them summarized above.  He has never had high risk adenomas.  His last colonoscopy was in 2013.  At that time guideline recommendations still supported recall colonoscopy at 5-year interval.  We had a very nice discussion today about newer more up-to-date guidelines that are based on our evolving understanding of colon cancer, colon polyps.  He understands that he is not "due" for a colonoscopy at this point.  He does understand that we would never ignore symptoms if he is having them and he or his wife will call if he has any serious concerning GI symptoms which I outlined for him at the visit and in his instructions.   Please see the "Patient Instructions" section for addition details about the plan.   Owens Loffler, MD [facility] 08/27/2018, 10:20 AM  Cc: Lawerance Cruel, MD

## 2018-09-17 DIAGNOSIS — Z23 Encounter for immunization: Secondary | ICD-10-CM | POA: Diagnosis not present

## 2018-11-23 ENCOUNTER — Encounter

## 2018-11-30 IMAGING — NM NM MISC PROCEDURE
6 series · 36 of 36 positions shown · non-contrast
Comparison: none

[Series 1: stress-gsp · 6.40mm/px · 6 of 512 frames shown]
[frame 43/512]
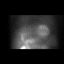
[frame 128/512]
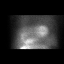
[frame 214/512]
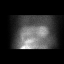
[frame 299/512]
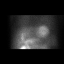
[frame 384/512]
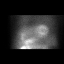
[frame 470/512]
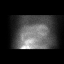

[Series 1: wbr_s-proj_st stress-gsp · 6.40mm/px · 6 of 512 frames shown]
[frame 43/512]
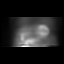
[frame 128/512]
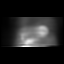
[frame 214/512]
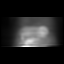
[frame 299/512]
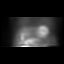
[frame 384/512]
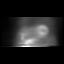
[frame 470/512]
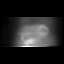

[Series 1: stress-sum-em · 6.40mm/px · 6 of 64 frames shown]
[frame 6/64]
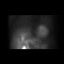
[frame 16/64]
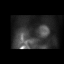
[frame 27/64]
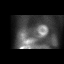
[frame 38/64]
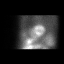
[frame 48/64]
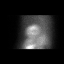
[frame 59/64]
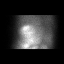

[Series 1: rest · 6.40mm/px · 6 of 64 frames shown]
[frame 6/64]
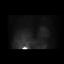
[frame 16/64]
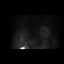
[frame 27/64]
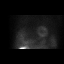
[frame 38/64]
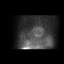
[frame 48/64]
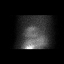
[frame 59/64]
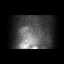

[Series 1: wbr_s-proj_st stress-sum-em · 6.40mm/px · 6 of 64 frames shown]
[frame 6/64]
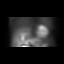
[frame 16/64]
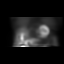
[frame 27/64]
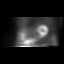
[frame 38/64]
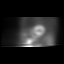
[frame 48/64]
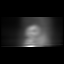
[frame 59/64]
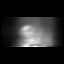

[Series 1: wbr_r-proj_st rest · 6.40mm/px · 6 of 64 frames shown]
[frame 6/64]
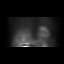
[frame 16/64]
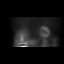
[frame 27/64]
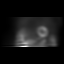
[frame 38/64]
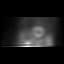
[frame 48/64]
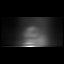
[frame 59/64]
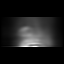

[36 of 36 positions shown; findings below may reference images not displayed]

Canned report from images found in remote index.

Refer to host system for actual result text.

## 2019-01-10 DIAGNOSIS — E669 Obesity, unspecified: Secondary | ICD-10-CM | POA: Insufficient documentation

## 2019-01-10 NOTE — Progress Notes (Signed)
Cardiology Office Note:    Date:  01/11/2019   ID:  Shane Cook, DOB August 20, 1941, MRN [id number]  PCP:  Lawerance Cruel, MD  Cardiologist:  No primary care provider on file.    Referring MD: Lawerance Cruel, MD   Chief Complaint  Patient presents with  . Sleep Apnea    History of Present Illness:    Shane Cook is a 78 y.o. male with a hx of mild OSA with an AHI of 13.9/hr and oxygen desaturations as low as 83% and underwent CPAP titration to 9cm H2O.  He is doing well with his CPAP device and thinks that he has gotten used to it.  He tolerates the mask and feels the pressure is adequate.  Since going on CPAP he feels rested in the am and has no significant daytime sleepiness.  He denies any significant mouth or nasal dryness or nasal congestion.  He does not think that he snores.     Past Medical History:  Diagnosis Date  . Allergic rhinitis   . Anemia   . Arthritis   . Asthma    no problems recently  . Colon polyp   . Decreased hearing   . Dupuytren contracture   . ED (erectile dysfunction)   . Edema    takes hydrochlorothiazide  . Heart murmur    hx yrs ago  . Hypercholesterolemia   . Hypertension   . Low testosterone   . Nephritis    78 yrs old  hospitalized. no problem since  . Peyronie's disease 06/2004  . Polymyalgia rheumatica (HCC)     Past Surgical History:  Procedure Laterality Date  . ACHILLES TENDON REPAIR Right   . CATARACT EXTRACTION     bilateral  . HERNIA REPAIR    . KNEE ARTHROSCOPY     left  . LEFT HEART CATH AND CORONARY ANGIOGRAPHY N/A 05/04/2017   Procedure: Left Heart Cath and Coronary Angiography;  Surgeon: Belva Crome, MD;  Location: [facility];  Service: Cardiovascular;  Laterality: N/A;  . MOUTH SURGERY     cyst 78 yrs old ?ethmoid  . peyronies  08?   penis bent/  . ROTATOR CUFF REPAIR     bilat 20 years apart  . TONSILLECTOMY    . TOTAL KNEE ARTHROPLASTY Left 08/30/2013   Dr Rhona Raider  . TOTAL KNEE  ARTHROPLASTY Left 08/30/2013   Procedure: TOTAL KNEE ARTHROPLASTY;  Surgeon: Hessie Dibble, MD;  Location: [facility];  Service: Orthopedics;  Laterality: Left;    Current Medications: Current Meds  Medication Sig  . acetaminophen (TYLENOL) 650 MG CR tablet Take 1,300 mg by mouth every 8 (eight) hours as needed for pain.  Marland Kitchen albuterol (PROVENTIL HFA;VENTOLIN HFA) 108 (90 BASE) MCG/ACT inhaler Inhale 2 puffs into the lungs every 4 (four) hours as needed for shortness of breath.   Marland Kitchen atorvastatin (LIPITOR) 40 MG tablet Take 20 mg by mouth daily.  . beta carotene w/minerals (OCUVITE) tablet Take 1 tablet by mouth daily.  . cetirizine (KLS ALLER-TEC) 10 MG tablet Take 10 mg by mouth daily.  . fluticasone (FLONASE) 50 MCG/ACT nasal spray Place 2 sprays into the nose daily as needed for rhinitis or allergies.   . hydrochlorothiazide (HYDRODIURIL) 25 MG tablet Take 12.5 mg by mouth daily.   . Hypertonic Nasal Wash (SINUS RINSE NA) Place 1 Dose into the nose daily as needed (congestion).  Marland Kitchen levothyroxine (SYNTHROID, LEVOTHROID) 125 MCG tablet Take 125 mcg by mouth  daily.   . mometasone (NASONEX) 50 MCG/ACT nasal spray Place 2 sprays into the nose 2 (two) times daily as needed (allergies).     Allergies:   Sulfa antibiotics; Sulfamethoxazole; and Amoxicillin   Social History   Socioeconomic History  . Marital status: Married    Spouse name: Not on file  . Number of children: 2  . Years of education: Not on file  . Highest education level: Not on file  Occupational History  . Occupation: Press photographer  Social Needs  . Financial resource strain: Not on file  . Food insecurity:    Worry: Not on file    Inability: Not on file  . Transportation needs:    Medical: Not on file    Non-medical: Not on file  Tobacco Use  . Smoking status: Former Smoker    Packs/day: 2.00    Years: 21.00    Pack years: 42.00    Types: Cigarettes    Start date: 04/24/1954    Last attempt to quit: 05/28/1975    Years since  quitting: 43.6  . Smokeless tobacco: Never Used  Substance and Sexual Activity  . Alcohol use: No  . Drug use: No  . Sexual activity: Not on file    Comment: MARRIED  Lifestyle  . Physical activity:    Days per week: Not on file    Minutes per session: Not on file  . Stress: Not on file  Relationships  . Social connections:    Talks on phone: Not on file    Gets together: Not on file    Attends religious service: Not on file    Active member of club or organization: Not on file    Attends meetings of clubs or organizations: Not on file    Relationship status: Not on file  Other Topics Concern  . Not on file  Social History Narrative   [facility] (06/03/17):   Originally from [facility]. He has only lived [place] & [place] only. 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 Has also traveled to [place]. Previously worked for [employer] in [employer]. 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 No known mold or asbestos exposure. 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 No pets currently. No bird exposure. No hot tub exposure.      Family History: The patient's family history includes Alzheimer's disease in his mother; Asthma in his grandchild; Emphysema in his father; Liver cancer in his father; Lung cancer in his father. There is no history of Colon cancer or Rheumatologic disease.  ROS:   Please see the history of present illness.    ROS  All other systems reviewed and negative.   EKGs/Labs/Other Studies Reviewed:    The following studies were reviewed today: PAP download  EKG:  EKG is not ordered today.    Recent Labs: No results found for requested labs within last 8760 hours.   Recent Lipid Panel No results found for: CHOL, TRIG, HDL, CHOLHDL, VLDL, LDLCALC, LDLDIRECT  Physical Exam:    VS:  BP 124/70   Pulse 67   Ht 5' 9.5" (1.765 m)   Wt 208 lb 1.9  oz (94.4 kg)   SpO2 96%   BMI 30.29 kg/m     Wt Readings from Last 3 Encounters:  01/11/19 208 lb 1.9 oz (94.4 kg)  08/27/18 206 lb 8 oz (93.7 kg)  08/12/18 207  lb 9.6 oz (94.2 kg)     GEN:  Well nourished, well developed in no acute distress HEENT: Normal NECK: No JVD; No carotid bruits LYMPHATICS: No lymphadenopathy CARDIAC: RRR, no murmurs, rubs, gallops RESPIRATORY:  Clear to auscultation without rales, wheezing or rhonchi  ABDOMEN: Soft, non-tender, non-distended MUSCULOSKELETAL:  No edema; No deformity  SKIN: Warm and dry NEUROLOGIC:  Alert and oriented x 3 PSYCHIATRIC:  Normal affect   ASSESSMENT:    1. OSA on CPAP   2. Obesity (BMI 30-39.9)    PLAN:    In order of problems listed above:  1. OSA - the patient is tolerating PAP therapy well without any problems. The PAP download was reviewed today and showed an AHI of 0.6/hr on 9 cm H2O with 80% compliance in using more than 4 hours nightly.  The patient has been using and benefiting from PAP use and will continue to benefit from therapy.  I am going to send an order for new CPAP supplies including nasal pillows and an entire new headgear.  His headgear is greater than a year old.  2.  Obesity - I have encouraged him to get into a routine exercise program and cut back on carbs and portions.    Medication Adjustments/Labs and Tests Ordered: Current medicines are reviewed at length with the patient today.  Concerns regarding medicines are outlined above.  No orders of the defined types were placed in this encounter.  No orders of the defined types were placed in this encounter.   Signed, Fransico Him, MD  01/11/2019 10:19 AM    [facility]

## 2019-01-11 ENCOUNTER — Ambulatory Visit (INDEPENDENT_AMBULATORY_CARE_PROVIDER_SITE_OTHER): Payer: Medicare Other | Admitting: Cardiology

## 2019-01-11 ENCOUNTER — Telehealth: Payer: Self-pay | Admitting: *Deleted

## 2019-01-11 ENCOUNTER — Encounter: Payer: Self-pay | Admitting: Cardiology

## 2019-01-11 VITALS — BP 124/70 | HR 67 | Ht 69.5 in | Wt 208.1 lb

## 2019-01-11 DIAGNOSIS — E669 Obesity, unspecified: Secondary | ICD-10-CM | POA: Diagnosis not present

## 2019-01-11 DIAGNOSIS — Z9989 Dependence on other enabling machines and devices: Secondary | ICD-10-CM | POA: Diagnosis not present

## 2019-01-11 DIAGNOSIS — G4733 Obstructive sleep apnea (adult) (pediatric): Secondary | ICD-10-CM

## 2019-01-11 NOTE — Telephone Encounter (Signed)
Order placed today to [facility] for Order for new nasal pillow mask and monthly supplies for nasal pillow mask

## 2019-01-11 NOTE — Addendum Note (Signed)
Addended by: Fransico Him R on: 01/11/2019 10:35 AM   Modules accepted: Level of Service

## 2019-01-11 NOTE — Patient Instructions (Signed)
Medication Instructions:  Your provider recommends that you continue on your current medications as directed. Please refer to the Current Medication list given to you today.   If you need a refill on your cardiac medications before your next appointment, please call your pharmacy.   Lab work: None  Testing/Procedures: None  Follow-Up: . Your provider wants you to follow-up in: 1 year with Dr. Radford Pax. You will receive a reminder letter in the mail two months in advance. If you don't receive a letter, please call our office to schedule the follow-up appointment.    Any Other Special Instructions Will Be Listed Below (If Applicable). Dr. Radford Pax has called in for you a new nasal pillow mask.

## 2019-03-10 ENCOUNTER — Ambulatory Visit: Payer: Medicare Other | Admitting: Cardiology

## 2019-06-01 DIAGNOSIS — H35033 Hypertensive retinopathy, bilateral: Secondary | ICD-10-CM | POA: Diagnosis not present

## 2019-06-01 DIAGNOSIS — D3131 Benign neoplasm of right choroid: Secondary | ICD-10-CM | POA: Diagnosis not present

## 2019-06-01 DIAGNOSIS — H04553 Acquired stenosis of bilateral nasolacrimal duct: Secondary | ICD-10-CM | POA: Diagnosis not present

## 2019-06-01 DIAGNOSIS — H353133 Nonexudative age-related macular degeneration, bilateral, advanced atrophic without subfoveal involvement: Secondary | ICD-10-CM | POA: Diagnosis not present

## 2019-06-01 DIAGNOSIS — H43813 Vitreous degeneration, bilateral: Secondary | ICD-10-CM | POA: Diagnosis not present

## 2019-06-16 DIAGNOSIS — D649 Anemia, unspecified: Secondary | ICD-10-CM | POA: Diagnosis not present

## 2019-06-16 DIAGNOSIS — E7800 Pure hypercholesterolemia, unspecified: Secondary | ICD-10-CM | POA: Diagnosis not present

## 2019-06-16 DIAGNOSIS — J449 Chronic obstructive pulmonary disease, unspecified: Secondary | ICD-10-CM | POA: Diagnosis not present

## 2019-06-16 DIAGNOSIS — I10 Essential (primary) hypertension: Secondary | ICD-10-CM | POA: Diagnosis not present

## 2019-06-16 DIAGNOSIS — E039 Hypothyroidism, unspecified: Secondary | ICD-10-CM | POA: Diagnosis not present

## 2019-06-16 DIAGNOSIS — Z79899 Other long term (current) drug therapy: Secondary | ICD-10-CM | POA: Diagnosis not present

## 2019-06-16 DIAGNOSIS — R5383 Other fatigue: Secondary | ICD-10-CM | POA: Diagnosis not present

## 2019-06-21 DIAGNOSIS — Z0000 Encounter for general adult medical examination without abnormal findings: Secondary | ICD-10-CM | POA: Diagnosis not present

## 2019-06-21 DIAGNOSIS — R251 Tremor, unspecified: Secondary | ICD-10-CM | POA: Diagnosis not present

## 2019-06-21 DIAGNOSIS — Z8739 Personal history of other diseases of the musculoskeletal system and connective tissue: Secondary | ICD-10-CM | POA: Diagnosis not present

## 2019-06-21 DIAGNOSIS — I10 Essential (primary) hypertension: Secondary | ICD-10-CM | POA: Diagnosis not present

## 2019-06-21 DIAGNOSIS — N529 Male erectile dysfunction, unspecified: Secondary | ICD-10-CM | POA: Diagnosis not present

## 2019-06-21 DIAGNOSIS — D649 Anemia, unspecified: Secondary | ICD-10-CM | POA: Diagnosis not present

## 2019-06-21 DIAGNOSIS — J449 Chronic obstructive pulmonary disease, unspecified: Secondary | ICD-10-CM | POA: Diagnosis not present

## 2019-06-21 DIAGNOSIS — N183 Chronic kidney disease, stage 3 (moderate): Secondary | ICD-10-CM | POA: Diagnosis not present

## 2019-06-21 DIAGNOSIS — E7800 Pure hypercholesterolemia, unspecified: Secondary | ICD-10-CM | POA: Diagnosis not present

## 2019-06-21 DIAGNOSIS — E039 Hypothyroidism, unspecified: Secondary | ICD-10-CM | POA: Diagnosis not present

## 2019-07-11 DIAGNOSIS — N401 Enlarged prostate with lower urinary tract symptoms: Secondary | ICD-10-CM | POA: Diagnosis not present

## 2019-07-18 DIAGNOSIS — N401 Enlarged prostate with lower urinary tract symptoms: Secondary | ICD-10-CM | POA: Diagnosis not present

## 2019-07-18 DIAGNOSIS — N5201 Erectile dysfunction due to arterial insufficiency: Secondary | ICD-10-CM | POA: Diagnosis not present

## 2019-07-18 DIAGNOSIS — R350 Frequency of micturition: Secondary | ICD-10-CM | POA: Diagnosis not present

## 2019-07-18 DIAGNOSIS — E291 Testicular hypofunction: Secondary | ICD-10-CM | POA: Diagnosis not present

## 2019-07-20 DIAGNOSIS — S0502XA Injury of conjunctiva and corneal abrasion without foreign body, left eye, initial encounter: Secondary | ICD-10-CM | POA: Diagnosis not present

## 2019-07-25 DIAGNOSIS — S0502XD Injury of conjunctiva and corneal abrasion without foreign body, left eye, subsequent encounter: Secondary | ICD-10-CM | POA: Diagnosis not present

## 2019-07-25 DIAGNOSIS — E291 Testicular hypofunction: Secondary | ICD-10-CM | POA: Diagnosis not present

## 2019-07-26 ENCOUNTER — Telehealth: Payer: Self-pay | Admitting: Pulmonary Disease

## 2019-07-26 NOTE — Telephone Encounter (Signed)
BQ patient Pt overdue for PFT 3/20  Please schedule.

## 2019-07-26 NOTE — Telephone Encounter (Signed)
Scheduled pft on 08/12/2019-pr

## 2019-08-03 ENCOUNTER — Other Ambulatory Visit: Payer: Self-pay | Admitting: Pulmonary Disease

## 2019-08-08 ENCOUNTER — Other Ambulatory Visit (HOSPITAL_COMMUNITY)
Admission: RE | Admit: 2019-08-08 | Discharge: 2019-08-08 | Disposition: A | Discharge status: Home / Self Care | Payer: Medicare Other | Source: Ambulatory Visit | Attending: Pulmonary Disease | Admitting: Pulmonary Disease

## 2019-08-08 DIAGNOSIS — Z20828 Contact with and (suspected) exposure to other viral communicable diseases: Secondary | ICD-10-CM | POA: Insufficient documentation

## 2019-08-08 DIAGNOSIS — Z01812 Encounter for preprocedural laboratory examination: Secondary | ICD-10-CM | POA: Insufficient documentation

## 2019-08-09 LAB — NOVEL CORONAVIRUS, NAA (HOSP ORDER, SEND-OUT TO REF LAB; TAT 18-24 HRS): SARS-CoV-2, NAA: NOT DETECTED

## 2019-08-11 NOTE — Progress Notes (Signed)
@Patient  ID: Shane Cook, male    DOB: 10-31-1941, 78 y.o.   MRN: [id number]  Chief Complaint  Patient presents with  . Follow-up    F/U after PFT. States he has had some increased fatigue. Recently had labs done by PCP Dr. Harrington Challenger. Had his thyroid meds increased.     Referring provider: Lawerance Cruel, MD  HPI:  78 year old male followed in our office for ILD, obstructive sleep apnea and a history of right upper lobe nodule  PMH: Polymyalgia rheumatica Smoker/ Smoking History: Former smoker.  Quit 1976.  42-pack-year smoking history. Maintenance: None Pt of: Dr. Lake Bells / former Dr. Ashok Cordia patient  08/12/2019  - Visit   78 year old male former smoker followed in our office for interstitial lung disease and obstructive sleep apnea.  Patient presenting to our office today after completing pulmonary function test.  This pulmonary function test results are listed below:  08/12/2019-pulmonary function test-FVC 3.05 (74% predicted), postbronchodilator ratio 83, postbronchodilator FEV1 2.5 (85% predicted), no bronchodilator response, slight mid flow reversibility, DLCO 22.21 (90% predicted)  Patient is not on any sort of maintenance inhalers.  Overall patient feels that her breathing has worsened slightly since last office visit in September/2019.  He also has been struggling with ongoing fatigue.  He reports compliance with using his CPAP.  He has been working with primary care regarding his fatigue he reports that he had an elevated TSH and they recently increased his levothyroxine.  He also followed up with urology where he was told he had slightly reduced levels of testosterone.  It was felt by urology the patient did not need supplemental therapy of testosterone.  Patient is waiting to see if the increase of levothyroxine will help with his fatigue.  Patient would like flu vaccine today if available.   Tests:   PFT 10/23/17: FVC 3.00 L (32%) FEV1 2.30 L (77%) FEV1/FVC 0.77 FEF  25-75 1.86 L (88%)  DLCO corrected 66% 07/22/17: FVC 3.02 L (72%) FEV1 2.21 L (74%) FEV1/FVC 0.73 FEF 25-75 1.57 L (74%) negative bronchodilator response TLC 3.90 L (55%) RV 55% ERV 120% DLCO corrected 78% 02/03/2018-pulmonary function test- FVC 2.91 (70% predicted), postbronchodilator ratio 83, postbronchodilator FEV1 2.34 (78% predicted), no bronchodilator response, DLCO 23.19 (71% predicted)  08/12/2019-pulmonary function test-FVC 3.05 (74% predicted), postbronchodilator ratio 83, postbronchodilator FEV1 2.5 (85% predicted), no bronchodilator response, slight mid flow reversibility, DLCO 22.21 (90% predicted)  6MWT 10/22/17:  Walked 288 meters / Baseline Sat 100% on RA / Nadir Sat 99% on RA @ end of test 07/23/17:  Walked 304 meters / Baseline Sat 99% on RA / Nadir Sat 98% on RA @ end of test  CPAP TITRATION (08/05/17):  Recommended trial of CPAP at 9 cm H2O with a Medium size Resmed Full Face Mask AirFit F20 mask and heated humidification.  POLYSOMNOGRAM (07/03/17): IMPRESSIONS - Mild obstructive sleep apnea occurred during this study (AHI = 13.9/h). - No significant central sleep apnea occurred during this study (CAI = 0.2/h). - Mild oxygen desaturation was noted during this study (Min O2 = 83.00%). - The patient snored with Moderate snoring volume. - No cardiac abnormalities were noted during this study. - Clinically significant periodic limb movements did not occur during sleep. No significant associated arousals.  IMAGING HRCT CHEST W/O 07/30/17:   Nonspecific reticulation in the bases and periphery of both lungs bilaterally  08/02/2018-CT chest without contrast-similar bilateral pulmonary nodules which can be presumed to be benign, no acute process in chest, mild interstitial  lung disease grossly similar to 2018 high-resolution CT chest this is again favored to represent NSIP  CARDIAC LHC (05/04/17):  Normal coronary arteries.  Normal left ventricular systolic function, EF 123456,  with upper normal LVEDP of 18 mmHg. LVEDP suggests mild diastolic dysfunction.  False positive myocardial perfusion imaging.  TTE (04/09/17):  LV normal in size with mild LVH. No wall motion abnormalities. Normal diastolic function. LA & RA normal in size. RV normal in size and function. No aortic stenosis or regurgitation. Aortic root normal in size. No mitral stenosis or regurgitation. Mild pulmonic regurgitation without stenosis. Mild tricuspid regurgitation. No pericardial effusion.  Labs: November 2018 ANA negative, centromere negative, CCP negative, double-stranded DNA negative, extended spectrum myositis panel negative, Jo 1-, rheumatoid factor negative, RNP negative, ENA negative, SSA/SSB negative, SCL 70 negative   FENO:  No results found for: NITRICOXIDE  PFT: PFT Results Latest Ref Rng & Units 08/12/2019 02/03/2018 10/23/2017 07/22/2017  FVC-Pre L 3.05 2.91 3.00 3.02  FVC-Predicted Pre % 74 70 72 72  FVC-Post L 3.02 2.81 - 2.98  FVC-Predicted Post % 74 68 - 72  Pre FEV1/FVC % % 80 81 77 73  Post FEV1/FCV % % 83 83 - 77  FEV1-Pre L 2.43 2.35 2.30 2.21  FEV1-Predicted Pre % 83 79 77 74  FEV1-Post L 2.50 2.34 - 2.30  DLCO UNC% % 90 71 65 76  DLCO COR %Predicted % 130 110 101 117  TLC L 7.63 4.52 - -  TLC % Predicted % 108 64 - -  RV % Predicted % 95 66 - -    SIX MIN WALK 08/12/2018 10/22/2017 07/23/2017  Medications - Lipitor 40mg , Procar 5mg , Hydrodiuril  25mg  and Synthroid all taken at 8:am Lipitor 40mg , Proscar 5mg , Hydrodiuril 25mg , synthoid 129mcg & ocuvite all taken at 8:00am  Supplimental Oxygen during Test? (L/min) No No No  Laps - 6 6  Partial Lap (in Meters) - 0 16  Baseline BP (sitting) - 120/62 122/70  Baseline Heartrate - 63 75  Baseline Dyspnea (Borg Scale) - 0 3  Baseline Fatigue (Borg Scale) - 1 0  Baseline SPO2 - 100 99  BP (sitting) - 126/68 132/68  Heartrate - 97 102  Dyspnea (Borg Scale) - 2 3  Fatigue (Borg Scale) - 0.5 0.5  SPO2 - 99 98  BP  (sitting) - 124/66 126/64  Heartrate - 77 82  SPO2 - 99 98  Stopped or Paused before Six Minutes - No No  Distance Completed - 288 304  Tech Comments: steady walk, no desat TA/CMA  test performed with forehead probe. pt completed test at moderate pace with no desats or complaints.  test performed with forehead probe. pt walked at slow pace with no complaints or desats.     Imaging: No results found.  Lab Results:  CBC    Component Value Date/Time   WBC 6.7 04/27/2017 1031   WBC 8.2 11/12/2014 1848   RBC 4.46 04/27/2017 1031   RBC 5.44 11/12/2014 1848   HGB 13.5 04/27/2017 1031   HGB 13.9 10/07/2007 1536   HCT 40.8 04/27/2017 1031   HCT 40.6 10/07/2007 1536   PLT 231 04/27/2017 1031   MCV 92 04/27/2017 1031   MCV 88.8 10/07/2007 1536   MCH 30.3 04/27/2017 1031   MCH 30.9 11/12/2014 1848   MCHC 33.1 04/27/2017 1031   MCHC 33.1 11/12/2014 1848   RDW 14.7 04/27/2017 1031   RDW 13.3 10/07/2007 1536   LYMPHSABS 1.5 04/27/2017 1031  LYMPHSABS 1.8 10/07/2007 1536   MONOABS 0.8 11/12/2014 1848   MONOABS 0.4 10/07/2007 1536   EOSABS 0.2 04/27/2017 1031   BASOSABS 0.0 04/27/2017 1031   BASOSABS 0.2 (H) 10/07/2007 1536    BMET    Component Value Date/Time   NA 146 (H) 04/27/2017 1031   K 4.3 04/27/2017 1031   CL 103 04/27/2017 1031   CO2 27 04/27/2017 1031   GLUCOSE 101 (H) 04/27/2017 1031   GLUCOSE 105 (H) 11/12/2014 1856   BUN 15 04/27/2017 1031   CREATININE 1.26 04/27/2017 1031   CALCIUM 9.3 04/27/2017 1031   GFRNONAA 55 (L) 04/27/2017 1031   GFRAA 63 04/27/2017 1031    BNP No results found for: BNP  ProBNP    Component Value Date/Time   PROBNP 25 05/25/2017 1129    Specialty Problems      Pulmonary Problems   SOB (shortness of breath)   Snoring   Restrictive lung disease    10/23/17: FVC 3.00 L (32%) FEV1 2.30 L (77%) FEV1/FVC 0.77 FEF 25-75 1.86 L (88%)  DLCO corrected 66% 07/22/17: FVC 3.02 L (72%) FEV1 2.21 L (74%) FEV1/FVC 0.73 FEF 25-75 1.57 L  (74%) negative bronchodilator response TLC 3.90 L (55%) RV 55% ERV 120% DLCO corrected 78% 02/03/2018-pulmonary function test- FVC 2.91 (70% predicted), postbronchodilator ratio 83, postbronchodilator FEV1 2.34 (78% predicted), no bronchodilator response, DLCO 23.19 (71% predicted)  08/12/2019-pulmonary function test-FVC 3.05 (74% predicted), postbronchodilator ratio 83, postbronchodilator FEV1 2.5 (85% predicted), no bronchodilator response, slight mid flow reversibility, DLCO 22.21 (90% predicted)      ILD (interstitial lung disease) ([place])    2019 CT chest favors NSIP      Lung nodule < 6cm on CT    25mm RUL      OSA on CPAP    POLYSOMNOGRAM (07/03/17): IMPRESSIONS - Mild obstructive sleep apnea occurred during this study (AHI = 13.9/h). - No significant central sleep apnea occurred during this study (CAI = 0.2/h). - Mild oxygen desaturation was noted during this study (Min O2 = 83.00%). - The patient snored with Moderate snoring volume. - No cardiac abnormalities were noted during this study. - Clinically significant periodic limb movements did not occur during sleep. No significant associated arousals.         Allergies  Allergen Reactions  . Sulfa Antibiotics     Questionable allergy  . Sulfamethoxazole Other (See Comments)    Family history of allergy - patient never took.  Marland Kitchen Amoxicillin Rash    Has patient had a PCN reaction causing immediate rash, facial/tongue/throat swelling, SOB or lightheadedness with hypotension: Yes Has patient had a PCN reaction causing severe rash involving mucus membranes or skin necrosis: No Has patient had a PCN reaction that required hospitalization: No Has patient had a PCN reaction occurring within the last 10 years: No If all of the above answers are "NO", then may proceed with Cephalosporin use.     Immunization History  Administered Date(s) Administered  . Fluad Quad(high Dose 65+) 08/12/2019  . Influenza-Unspecified 08/08/2016     Past Medical History:  Diagnosis Date  . Allergic rhinitis   . Anemia   . Arthritis   . Asthma    no problems recently  . Colon polyp   . Decreased hearing   . Dupuytren contracture   . ED (erectile dysfunction)   . Edema    takes hydrochlorothiazide  . Heart murmur    hx yrs ago  . Hypercholesterolemia   . Hypertension   .  Low testosterone   . Nephritis    78 yrs old  hospitalized. no problem since  . Peyronie's disease 06/2004  . Polymyalgia rheumatica ([facility])     Tobacco History: Social History   Tobacco Use  Smoking Status Former Smoker  . Packs/day: 2.00  . Years: 21.00  . Pack years: 42.00  . Types: Cigarettes  . Start date: 04/24/1954  . Quit date: 05/28/1975  . Years since quitting: 44.2  Smokeless Tobacco Never Used   Counseling given: Yes   Continue to not smoke  Outpatient Encounter Medications as of 08/12/2019  Medication Sig  . acetaminophen (TYLENOL) 650 MG CR tablet Take 1,300 mg by mouth every 8 (eight) hours as needed for pain.  Marland Kitchen albuterol (PROVENTIL HFA;VENTOLIN HFA) 108 (90 BASE) MCG/ACT inhaler Inhale 2 puffs into the lungs every 4 (four) hours as needed for shortness of breath.   Marland Kitchen atorvastatin (LIPITOR) 40 MG tablet Take 20 mg by mouth daily.  . beta carotene w/minerals (OCUVITE) tablet Take 1 tablet by mouth daily.  . cetirizine (KLS ALLER-TEC) 10 MG tablet Take 10 mg by mouth daily.  . fluticasone (FLONASE) 50 MCG/ACT nasal spray Place 2 sprays into the nose daily as needed for rhinitis or allergies.   . hydrochlorothiazide (HYDRODIURIL) 25 MG tablet Take 12.5 mg by mouth daily.   . Hypertonic Nasal Wash (SINUS RINSE NA) Place 1 Dose into the nose daily as needed (congestion).  Marland Kitchen levothyroxine (SYNTHROID) 137 MCG tablet   . mometasone (NASONEX) 50 MCG/ACT nasal spray Place 2 sprays into the nose 2 (two) times daily as needed (allergies).  . [DISCONTINUED] levothyroxine (SYNTHROID, LEVOTHROID) 125 MCG tablet Take 125 mcg by mouth daily.    No  facility-administered encounter medications on file as of 08/12/2019.      Review of Systems  Review of Systems  Constitutional: Positive for fatigue. Negative for activity change, chills, fever and unexpected weight change.  HENT: Negative for postnasal drip, rhinorrhea, sinus pressure, sinus pain and sore throat.   Eyes: Negative.   Respiratory: Positive for shortness of breath. Negative for cough and wheezing.   Cardiovascular: Negative for chest pain and palpitations.  Gastrointestinal: Negative for constipation, diarrhea, nausea and vomiting.  Endocrine: Negative.   Genitourinary: Negative.   Musculoskeletal: Negative.   Skin: Negative.   Neurological: Negative for dizziness and headaches.  Psychiatric/Behavioral: Negative.  Negative for dysphoric mood. The patient is not nervous/anxious.   All other systems reviewed and are negative.    Physical Exam  BP 122/74   Pulse 66   Temp (!) 97.3 F (36.3 C) (Temporal)   Ht 5\' 10"  (1.778 m)   Wt 205 lb (93 kg)   SpO2 98%   BMI 29.41 kg/m   Wt Readings from Last 5 Encounters:  08/12/19 205 lb (93 kg)  01/11/19 208 lb 1.9 oz (94.4 kg)  08/27/18 206 lb 8 oz (93.7 kg)  08/12/18 207 lb 9.6 oz (94.2 kg)  02/03/18 202 lb (91.6 kg)    BMI Readings from Last 5 Encounters:  08/12/19 29.41 kg/m  01/11/19 30.29 kg/m  08/27/18 30.06 kg/m  08/12/18 30.06 kg/m  02/03/18 28.98 kg/m     Physical Exam Vitals signs and nursing note reviewed.  Constitutional:      General: He is not in acute distress.    Appearance: Normal appearance. He is obese.  HENT:     Head: Normocephalic and atraumatic.     Right Ear: Hearing, tympanic membrane, ear canal and external ear  normal.     Left Ear: Hearing, tympanic membrane, ear canal and external ear normal.     Nose: Nose normal. No mucosal edema or rhinorrhea.     Right Turbinates: Not enlarged.     Left Turbinates: Not enlarged.     Mouth/Throat:     Mouth: Mucous membranes are  dry.     Pharynx: Oropharynx is clear. No oropharyngeal exudate.  Eyes:     Pupils: Pupils are equal, round, and reactive to light.  Neck:     Musculoskeletal: Normal range of motion.  Cardiovascular:     Rate and Rhythm: Normal rate and regular rhythm.     Pulses: Normal pulses.     Heart sounds: Normal heart sounds. No murmur.  Pulmonary:     Effort: Pulmonary effort is normal.     Breath sounds: Normal breath sounds. No decreased breath sounds, wheezing or rales.  Abdominal:     General: Bowel sounds are normal. There is distension.     Palpations: Abdomen is soft.     Tenderness: There is no abdominal tenderness.  Musculoskeletal:     Right lower leg: Edema (Trace) present.     Left lower leg: Edema (Trace) present.  Lymphadenopathy:     Cervical: No cervical adenopathy.  Skin:    General: Skin is warm and dry.     Capillary Refill: Capillary refill takes less than 2 seconds.     Findings: No erythema or rash.  Neurological:     General: No focal deficit present.     Mental Status: He is alert and oriented to person, place, and time.     Motor: No weakness.     Coordination: Coordination normal.     Gait: Gait is intact. Gait normal.  Psychiatric:        Mood and Affect: Mood normal.        Behavior: Behavior normal. Behavior is cooperative.        Thought Content: Thought content normal.        Judgment: Judgment normal.       Assessment & Plan:   Restrictive lung disease Stable on pulmonary function test today Likely related to interstitial lung disease  Plan: We will repeat pulmonary function test in 1 year We will get patient established with Dr. Vaughan Browner for future pulmonary follow-up Can consider repeating CT of chest in 2021 Increase daily physical activity High-dose flu vaccine today  ILD (interstitial lung disease) ([place]) Stable pulmonary function test today 2019 CT chest favors NSIP  Plan: High-dose flu vaccine today Could consider repeating CT  of chest if fatigue and symptoms do not improve despite accurately treating hypothyroidism We will bring patient in to establish care with Dr. Vaughan Browner in 2 months Repeat pulmonary function test in 1 year  OSA on CPAP Plan: Continue CPAP therapy  Abnormal findings on diagnostic imaging of lung Pulmonary nodules on 2019 CT chest stable and favored to be benign NSIP on CT chest in 2019  Plan: Repeat pulmonary function test in 1 year Can consider repeating CT of chest after establishing care with Dr. Vaughan Browner, likely will be repeating CT chest in 2021  Healthcare maintenance Plan: Flu vaccine today We will need to request pneumonia vaccine records from primary care Increase daily physical activity    Return in about 2 months (around 10/12/2019), or if symptoms worsen or fail to improve, for Follow up with Dr. Vaughan Browner.   Lauraine Rinne, NP 08/12/2019   This appointment was 24  minutes long with over 50% of the time in direct face-to-face patient care, assessment, plan of care, and follow-up.

## 2019-08-12 ENCOUNTER — Ambulatory Visit (INDEPENDENT_AMBULATORY_CARE_PROVIDER_SITE_OTHER): Payer: Medicare Other | Admitting: Pulmonary Disease

## 2019-08-12 ENCOUNTER — Telehealth: Payer: Self-pay | Admitting: Pulmonary Disease

## 2019-08-12 ENCOUNTER — Other Ambulatory Visit: Payer: Self-pay

## 2019-08-12 ENCOUNTER — Encounter: Payer: Self-pay | Admitting: Pulmonary Disease

## 2019-08-12 VITALS — BP 122/74 | HR 66 | Temp 97.3°F | Ht 70.0 in | Wt 205.0 lb

## 2019-08-12 DIAGNOSIS — Z9989 Dependence on other enabling machines and devices: Secondary | ICD-10-CM | POA: Diagnosis not present

## 2019-08-12 DIAGNOSIS — J849 Interstitial pulmonary disease, unspecified: Secondary | ICD-10-CM

## 2019-08-12 DIAGNOSIS — J984 Other disorders of lung: Secondary | ICD-10-CM | POA: Diagnosis not present

## 2019-08-12 DIAGNOSIS — R5383 Other fatigue: Secondary | ICD-10-CM

## 2019-08-12 DIAGNOSIS — Z0000 Encounter for general adult medical examination without abnormal findings: Secondary | ICD-10-CM | POA: Diagnosis not present

## 2019-08-12 DIAGNOSIS — Z23 Encounter for immunization: Secondary | ICD-10-CM

## 2019-08-12 DIAGNOSIS — R918 Other nonspecific abnormal finding of lung field: Secondary | ICD-10-CM | POA: Insufficient documentation

## 2019-08-12 DIAGNOSIS — G4733 Obstructive sleep apnea (adult) (pediatric): Secondary | ICD-10-CM | POA: Diagnosis not present

## 2019-08-12 LAB — PULMONARY FUNCTION TEST
DL/VA % pred: 130 %
DL/VA: 5.11 ml/min/mmHg/L
DLCO unc % pred: 90 %
DLCO unc: 22.21 ml/min/mmHg
FEF 25-75 Post: 2.6 L/sec
FEF 25-75 Pre: 2.24 L/sec
FEF2575-%Change-Post: 16 %
FEF2575-%Pred-Post: 128 %
FEF2575-%Pred-Pre: 110 %
FEV1-%Change-Post: 2 %
FEV1-%Pred-Post: 85 %
FEV1-%Pred-Pre: 83 %
FEV1-Post: 2.5 L
FEV1-Pre: 2.43 L
FEV1FVC-%Change-Post: 3 %
FEV1FVC-%Pred-Pre: 111 %
FEV6-%Change-Post: -1 %
FEV6-%Pred-Post: 79 %
FEV6-%Pred-Pre: 80 %
FEV6-Post: 3.02 L
FEV6-Pre: 3.05 L
FEV6FVC-%Pred-Post: 107 %
FEV6FVC-%Pred-Pre: 107 %
FVC-%Change-Post: -1 %
FVC-%Pred-Post: 74 %
FVC-%Pred-Pre: 74 %
FVC-Post: 3.02 L
FVC-Pre: 3.05 L
Post FEV1/FVC ratio: 83 %
Post FEV6/FVC ratio: 100 %
Pre FEV1/FVC ratio: 80 %
Pre FEV6/FVC Ratio: 100 %
RV % pred: 95 %
RV: 2.53 L
TLC % pred: 108 %
TLC: 7.63 L

## 2019-08-12 NOTE — Assessment & Plan Note (Signed)
Plan: Continue CPAP therapy 

## 2019-08-12 NOTE — Patient Instructions (Addendum)
You were seen today by Lauraine Rinne, NP  for:   1. ILD (interstitial lung disease) [facility])  Pulmonary Function Test stable today   For right now we can tentatively plan on repeating your pulmonary function testing in 1 year or so September 2021 We can also tentatively plan on repeating a CT of your chest in 2021 unless Dr. Vaughan Browner at your next appointment believes that this should be moved up  High-dose flu vaccine today  Work on increasing your daily physical activity  2. OSA on CPAP  We recommend that you continue using your CPAP daily >>>Keep up the hard work using your device >>> Goal should be wearing this for the entire night that you are sleeping, at least 4 to 6 hours  Remember:  . Do not drive or operate heavy machinery if tired or drowsy.  . Please notify the supply company and office if you are unable to use your device regularly due to missing supplies or machine being broken.  . Work on maintaining a healthy weight and following your recommended nutrition plan  . Maintain proper daily exercise and movement  . Maintaining proper use of your device can also help improve management of other chronic illnesses such as: Blood pressure, blood sugars, and weight management.   BiPAP/ CPAP Cleaning:  >>>Clean weekly, with Dawn soap, and bottle brush.  Set up to air dry.  3. Restrictive lung disease  Still present on pulmonary function testing today This means that you have difficulty taking a breath in, this is mild and likely related to the interstitial lung disease that she had  4. Fatigue, unspecified type  Continue working with PCP on this   5. Abnormal findings on diagnostic imaging of lung  We will tentatively plan on repeating a CT of your chest in 2021 unless your fatigue does not improve with primary care's management of your thyroid     Follow Up:    Return in about 2 months (around 10/12/2019), or if symptoms worsen or fail to improve, for Follow up with Dr.  Vaughan Browner.   Please do your part to reduce the spread of COVID-19:      Reduce your risk of any infection  and COVID19 by using the similar precautions used for avoiding the common cold or flu:  Marland Kitchen Wash your hands often with soap and warm water for at least 20 seconds.  If soap and water are not readily available, use an alcohol-based hand sanitizer with at least 60% alcohol.  . If coughing or sneezing, cover your mouth and nose by coughing or sneezing into the elbow areas of your shirt or coat, into a tissue or into your sleeve (not your hands). Langley Gauss A MASK when in public  . Avoid shaking hands with others and consider head nods or verbal greetings only. . Avoid touching your eyes, nose, or mouth with unwashed hands.  . Avoid close contact with people who are sick. . Avoid places or events with large numbers of people in one location, like concerts or sporting events. . If you have some symptoms but not all symptoms, continue to monitor at home and seek medical attention if your symptoms worsen. . If you are having a medical emergency, call 911.   [place] / e-Visit: [email or web address]         [facility]: [facility]: [phone]  [facility]: [phone]     It is flu season:   >>> Best ways to protect herself from the flu: Receive the yearly flu vaccine, practice good hand hygiene washing with soap and also using hand sanitizer when available, eat a nutritious meals, get adequate rest, hydrate appropriately   Please contact the office if your symptoms worsen or you have concerns that you are not improving.   Thank you for choosing [facility] for your healthcare, and for allowing [place] to partner with you on your healthcare journey. I am thankful to be able to provide care to you today.   Wyn Quaker FNP-C   Exercise Information for Aging Adults Staying physically active is important as you age. The four types of exercises that are best for older adults are endurance, strength, balance, and flexibility. Contact your health care provider before you start any exercise routine. Ask your health care provider what activities are safe for you. What are the risks? Risks associated with exercising include:  Overdoing it. This may lead to sore muscles or fatigue.  Falls.  Injuries.  Dehydration. How to do these exercises Endurance exercises Endurance (aerobic) exercises raise your breathing rate and heart rate. Increasing your endurance helps you to do everyday tasks and stay healthy. By improving the health of your body system that includes your heart, lungs, and blood vessels (circulatory system), you may also delay or prevent diseases such as heart disease, diabetes, and bone loss (osteoporosis). Types of endurance exercises include:  Sports.  Indoor activities, such as using gym equipment, doing water aerobics, or dancing.  Outdoor activities, such as biking or jogging.  Tasks around the house, such as gardening, yard work, and heavy household chores like cleaning.  Walking, such as hiking or walking around your neighborhood. When doing endurance exercises, make sure you:  Are aware of your surroundings.  Use safety equipment as directed.  Dress in layers when exercising outdoors.  Drink plenty of water to stay well hydrated. Build up endurance slowly. Start with 10 minutes at a time, and gradually build up to doing 30 minutes at a time. Unless your health care provider gave you different instructions, aim to exercise for a total of 150 minutes a week. Spread out that time so you are working on endurance on 3 or more days a week. Strength exercises Lifting, pulling, or pushing weights helps to strengthen muscles. Having stronger muscles makes it easier to do everyday  activities, such as getting up from a chair, climbing stairs, carrying groceries, and playing with grandchildren. Strength exercises include arm and leg exercises that may be done:  With weights.  Without weights (using your own body weight).  With a resistance band. When doing strength exercises:  Move smoothly and steadily. Do not suddenly thrust or jerk the weights, the resistance band, or your body.  Start with no weights or with light weights, and gradually add more weight over time. Eventually, aim to use weights that are hard or very hard for you to lift. This means that you are able to do 8 repetitions with the weight, and the last few repetitions are very challenging.  Lift or push weights into position for 3 seconds, hold the position for 1 second, and then take 3 seconds to return to your starting position.  Breathe out (exhale) during difficult movements, like lifting or pushing weights. Breathe in (inhale) to relax your muscles before the next repetition.  Consider alternating arms or legs, especially  when you first start strength exercises.  Expect some slight muscle soreness after each session. Do strength exercises on 2 or more days a week, for 30 minutes at a time. Avoid exercising the same muscle groups two days in a row. For example, if you work on your leg muscles one day, work on your arm muscles the next day. When you can do two sets of 10-15 repetitions with a certain weight, increase the amount of weight. Balance Balance exercises can help to prevent falls. Balance exercises include:  Standing on one foot.  Heel-to-toe walk.  Balance walk.  Tai chi. Make sure you have something sturdy to hold onto while doing balance exercises, such as a sturdy chair. As your balance improves, challenge yourself by holding onto the chair with one hand instead of two, and then with no hands. Trying exercises with your eyes closed also challenges your balance, but be sure to have  a sturdy surface (like a countertop) close by in case you need it. Do balance exercises as often as you want, or as often as directed by your health care provider. Strength exercises for the lower body also help to improve balance. Flexibility  Flexibility exercises improve how far you can bend, straighten, move, or rotate parts of your body (range of motion). These exercises also help you to do everyday activities such as getting dressed or reaching for objects. Flexibility exercises include stretching different parts of the body, and they may be done in a standing or seated position or on the floor. When stretching, make sure you:  Keep a slight bend in your arms and legs. Avoid completely straightening ("locking") your joints.  Do not stretch so far that you feel pain. You should feel a mild stretching feeling. You may try stretching farther as you become more flexible over time.  Relax and breathe between stretches.  Hold onto something sturdy for balance as needed. Hold each stretch for 10-30 seconds. Repeat each stretch 3-5 times. General safety tips  Exercise in well-lit areas.  Do not hold your breath during exercises or stretches.  Warm up before exercising, and cool down after exercising. This can help prevent injury.  Drink plenty of water during exercise or any activity that makes you sweat.  Use smooth, steady movements. Do not use sudden, jerking movements, especially when lifting weights or doing flexibility exercises.  If you are not sure if an exercise is safe for you, or you are not sure how to do an exercise, talk with your health care provider. This is especially important if you have had surgery on muscles, bones, or joints (orthopedic surgery). Where to find more information You can find more information about exercise for older adults from:  Your local health department, fitness center, or community center. These facilities may have programs for aging adults.   [employer] on Aging: http://kim-miller.com/  National Council on Aging: www.ncoa.org Summary  Staying physically active is important as you age.  Make sure to contact your health care provider before you start any exercise routine. Ask your health care provider what activities are safe for you.  Doing endurance, strength, balance, and flexibility exercises can help to delay or prevent certain diseases, such as heart disease, diabetes, and bone loss (osteoporosis). This information is not intended to replace advice given to you by your health care provider. Make sure you discuss any questions you have with your health care provider. Document Released: 04/01/2017 Document Revised: 09/02/2018 Document Reviewed: 04/01/2017 Elsevier Patient Education  [place].      Influenza Virus Vaccine injection What is this medicine? INFLUENZA VIRUS VACCINE (in floo EN zuh VAHY ruhs vak SEEN) helps to reduce the risk of getting influenza also known as the flu. The vaccine only helps protect you against some strains of the flu. This medicine may be used for other purposes; ask your health care provider or pharmacist if you have questions. COMMON BRAND NAME(S): Afluria, Afluria Quadrivalent, Agriflu, Alfuria, FLUAD, Fluarix, Fluarix Quadrivalent, Flublok, Flublok Quadrivalent, FLUCELVAX, Flulaval, Fluvirin, Fluzone, Fluzone High-Dose, Fluzone Intradermal What should I tell my health care provider before I take this medicine? They need to know if you have any of these conditions:  bleeding disorder like hemophilia  fever or infection  Guillain-Barre syndrome or other neurological problems  immune system problems  infection with the human immunodeficiency virus (HIV) or AIDS  low blood platelet counts  multiple sclerosis  an unusual or allergic reaction to influenza virus vaccine, latex, other medicines, foods, dyes, or preservatives. Different brands of vaccines contain different  allergens. Some may contain latex or eggs. Talk to your doctor about your allergies to make sure that you get the right vaccine.  pregnant or trying to get pregnant  breast-feeding How should I use this medicine? This vaccine is for injection into a muscle or under the skin. It is given by a health care professional. A copy of Vaccine Information Statements will be given before each vaccination. Read this sheet carefully each time. The sheet may change frequently. Talk to your healthcare provider to see which vaccines are right for you. Some vaccines should not be used in all age groups. Overdosage: If you think you have taken too much of this medicine contact a poison control center or emergency room at once. NOTE: This medicine is only for you. Do not share this medicine with others. What if I miss a dose? This does not apply. What may interact with this medicine?  chemotherapy or radiation therapy  medicines that lower your immune system like etanercept, anakinra, infliximab, and adalimumab  medicines that treat or prevent blood clots like warfarin  phenytoin  steroid medicines like prednisone or cortisone  theophylline  vaccines This list may not describe all possible interactions. Give your health care provider a list of all the medicines, herbs, non-prescription drugs, or dietary supplements you use. Also tell them if you smoke, drink alcohol, or use illegal drugs. Some items may interact with your medicine. What should I watch for while using this medicine? Report any side effects that do not go away within 3 days to your doctor or health care professional. Call your health care provider if any unusual symptoms occur within 6 weeks of receiving this vaccine. You may still catch the flu, but the illness is not usually as bad. You cannot get the flu from the vaccine. The vaccine will not protect against colds or other illnesses that may cause fever. The vaccine is needed every  year. What side effects may I notice from receiving this medicine? Side effects that you should report to your doctor or health care professional as soon as possible:  allergic reactions like skin rash, itching or hives, swelling of the face, lips, or tongue Side effects that usually do not require medical attention (report to your doctor or health care professional if they continue or are bothersome):  fever  headache  muscle aches and pains  pain, tenderness, redness, or swelling at the injection site  tiredness This list  may not describe all possible side effects. Call your doctor for medical advice about side effects. You may report side effects to FDA at 1-800-FDA-1088. Where should I keep my medicine? The vaccine will be given by a health care professional in a clinic, pharmacy, doctor's office, or other health care setting. You will not be given vaccine doses to store at home. NOTE: This sheet is a summary. It may not cover all possible information. If you have questions about this medicine, talk to your doctor, pharmacist, or health care provider.  2020 Elsevier/Gold Standard (2018-10-05 08:45:43)

## 2019-08-12 NOTE — Assessment & Plan Note (Signed)
Stable on pulmonary function test today Likely related to interstitial lung disease  Plan: We will repeat pulmonary function test in 1 year We will get patient established with Dr. Vaughan Browner for future pulmonary follow-up Can consider repeating CT of chest in 2021 Increase daily physical activity High-dose flu vaccine today

## 2019-08-12 NOTE — Assessment & Plan Note (Signed)
Stable pulmonary function test today 2019 CT chest favors NSIP  Plan: High-dose flu vaccine today Could consider repeating CT of chest if fatigue and symptoms do not improve despite accurately treating hypothyroidism We will bring patient in to establish care with Dr. Vaughan Browner in 2 months Repeat pulmonary function test in 1 year

## 2019-08-12 NOTE — Progress Notes (Signed)
Full PFT performed today. °

## 2019-08-12 NOTE — Telephone Encounter (Signed)
08/12/2019 1331  Triage,  Please contact patient's primary care provider and request vaccination records.  Specifically we would like to know if the patient has received the pneumonia vaccines.  We also like to request records of recent blood work so we get that added to our chart.  Please have them fax these results and records to our office with the attention to either myself or Dr. Vaughan Browner has patient will be establishing with Dr. Vaughan Browner and 8 weeks.  Wyn Quaker, FNP

## 2019-08-12 NOTE — Assessment & Plan Note (Signed)
Plan: Flu vaccine today We will need to request pneumonia vaccine records from primary care Increase daily physical activity

## 2019-08-12 NOTE — Telephone Encounter (Signed)
Spoke with [facility] at [facility] and requested the immunizations records and lab results to be faxed to our number. Will hold in triage until received.

## 2019-08-12 NOTE — Assessment & Plan Note (Signed)
Pulmonary nodules on 2019 CT chest stable and favored to be benign NSIP on CT chest in 2019  Plan: Repeat pulmonary function test in 1 year Can consider repeating CT of chest after establishing care with Dr. Vaughan Browner, likely will be repeating CT chest in 2021

## 2019-08-17 NOTE — Progress Notes (Signed)
Reviewed, agree 

## 2019-08-22 NOTE — Telephone Encounter (Signed)
We have received the lab work from [place] but still no immunization records.  Please contact [facility] again and request these immunization records.  We have received the lab results.  We will place lab results in Dr. Vaughan Browner look at folder as he will be seeing the patient next month.  Then they can go to scan.  Wyn Quaker, FNP

## 2019-08-22 NOTE — Telephone Encounter (Signed)
Called [facility] to have the immunizations records faxed to our office.  Per rep, these are being faxed to [facility] attn today.  Will hold message in triage to ensure follow-up.

## 2019-08-23 NOTE — Telephone Encounter (Signed)
Vaccination records have been obtained from [place].  Vaccination records have been updated.  Records have been sent for scanning.  Nothing further needed  Wyn Quaker, FNP

## 2019-08-30 DIAGNOSIS — D1801 Hemangioma of skin and subcutaneous tissue: Secondary | ICD-10-CM | POA: Diagnosis not present

## 2019-08-30 DIAGNOSIS — L821 Other seborrheic keratosis: Secondary | ICD-10-CM | POA: Diagnosis not present

## 2019-08-30 DIAGNOSIS — L820 Inflamed seborrheic keratosis: Secondary | ICD-10-CM | POA: Diagnosis not present

## 2019-09-14 DIAGNOSIS — I10 Essential (primary) hypertension: Secondary | ICD-10-CM | POA: Diagnosis not present

## 2019-09-14 DIAGNOSIS — D649 Anemia, unspecified: Secondary | ICD-10-CM | POA: Diagnosis not present

## 2019-09-14 DIAGNOSIS — N529 Male erectile dysfunction, unspecified: Secondary | ICD-10-CM | POA: Diagnosis not present

## 2019-09-14 DIAGNOSIS — E7800 Pure hypercholesterolemia, unspecified: Secondary | ICD-10-CM | POA: Diagnosis not present

## 2019-09-14 DIAGNOSIS — Z0000 Encounter for general adult medical examination without abnormal findings: Secondary | ICD-10-CM | POA: Diagnosis not present

## 2019-09-14 DIAGNOSIS — E039 Hypothyroidism, unspecified: Secondary | ICD-10-CM | POA: Diagnosis not present

## 2019-09-14 DIAGNOSIS — Z8739 Personal history of other diseases of the musculoskeletal system and connective tissue: Secondary | ICD-10-CM | POA: Diagnosis not present

## 2019-09-14 DIAGNOSIS — R251 Tremor, unspecified: Secondary | ICD-10-CM | POA: Diagnosis not present

## 2019-09-14 DIAGNOSIS — J449 Chronic obstructive pulmonary disease, unspecified: Secondary | ICD-10-CM | POA: Diagnosis not present

## 2019-09-14 DIAGNOSIS — N183 Chronic kidney disease, stage 3 unspecified: Secondary | ICD-10-CM | POA: Diagnosis not present

## 2019-09-20 DIAGNOSIS — N183 Chronic kidney disease, stage 3 unspecified: Secondary | ICD-10-CM | POA: Diagnosis not present

## 2019-09-20 DIAGNOSIS — J449 Chronic obstructive pulmonary disease, unspecified: Secondary | ICD-10-CM | POA: Diagnosis not present

## 2019-09-20 DIAGNOSIS — I10 Essential (primary) hypertension: Secondary | ICD-10-CM | POA: Diagnosis not present

## 2019-09-20 DIAGNOSIS — E039 Hypothyroidism, unspecified: Secondary | ICD-10-CM | POA: Diagnosis not present

## 2019-09-20 DIAGNOSIS — E7800 Pure hypercholesterolemia, unspecified: Secondary | ICD-10-CM | POA: Diagnosis not present

## 2019-09-20 DIAGNOSIS — D649 Anemia, unspecified: Secondary | ICD-10-CM | POA: Diagnosis not present

## 2019-11-01 ENCOUNTER — Encounter: Payer: Self-pay | Admitting: Emergency Medicine

## 2019-11-01 ENCOUNTER — Ambulatory Visit (INDEPENDENT_AMBULATORY_CARE_PROVIDER_SITE_OTHER): Payer: Medicare Other | Admitting: Emergency Medicine

## 2019-11-01 ENCOUNTER — Other Ambulatory Visit: Payer: Self-pay

## 2019-11-01 DIAGNOSIS — Z9989 Dependence on other enabling machines and devices: Secondary | ICD-10-CM | POA: Diagnosis not present

## 2019-11-01 DIAGNOSIS — R5383 Other fatigue: Secondary | ICD-10-CM | POA: Diagnosis not present

## 2019-11-01 DIAGNOSIS — J849 Interstitial pulmonary disease, unspecified: Secondary | ICD-10-CM

## 2019-11-01 DIAGNOSIS — R0602 Shortness of breath: Secondary | ICD-10-CM

## 2019-11-01 DIAGNOSIS — G4733 Obstructive sleep apnea (adult) (pediatric): Secondary | ICD-10-CM

## 2019-11-01 MED ORDER — SPIRIVA RESPIMAT 2.5 MCG/ACT IN AERS: 2.0000 | INHALATION_SPRAY | Freq: Every day | RESPIRATORY_TRACT | 0 refills | Status: DC

## 2019-11-01 NOTE — Assessment & Plan Note (Signed)
Multifactorial with what sounds like impact of his known interstitial disease, suspected coexisting obstruction that is currently untreated (does not use his albuterol), possibly some deconditioning and also changes with aging.  Most straightforward component to address would be possible obstructive lung disease.  I will do a trial on scheduled bronchodilator therapy to see if he gets benefit.  If so then we could possibly continue, consider using albuterol as needed more reliably.

## 2019-11-01 NOTE — Patient Instructions (Addendum)
We will plan to repeat your CT chest in September 2021.  Walking oximetry today on room air.  We will try starting Spiriva Respimat 2 puffs once a day. Keep track of whether this medication helps you. If so we may decide to continue  We will refill your albuterol so you can use 2 puffs if needed for shortness of breath.  Please continue your CPAP every night Follow with Dr Lamonte Sakai in 1 month or next available.

## 2019-11-01 NOTE — Assessment & Plan Note (Signed)
Good compliance with his CPAP.  70% usage for over 4 hours.  Sometimes naps during the day but overall good clinical response.  Pressure is set at 9 cm water.

## 2019-11-01 NOTE — Assessment & Plan Note (Signed)
Plan to repeat his high-resolution CT scan of the chest to look for interval change, stability.  Walking oximetry today.

## 2019-11-01 NOTE — Assessment & Plan Note (Signed)
Question whether there may be some crossover between this and his dyspnea. Need to rule out occult desaturation.

## 2019-11-01 NOTE — Progress Notes (Signed)
Patient seen in the office today and instructed on use of Spiriva Respimat 2.5.  Patient expressed understanding and demonstrated technique. 

## 2019-11-01 NOTE — Progress Notes (Signed)
Subjective:    Patient ID: Shane Cook, male    DOB: 1941-04-16, 78 y.o.   MRN: [id number]  HPI 78 year old gentleman, former smoker (23 pack years) who is been followed in our office by Dr. Ashok Cordia and then subsequently by Dr. Lake Bells for interstitial lung disease in an NSIP pattern, obstructive sleep apnea.  He has a history of polymyalgia rheumatica, right upper lobe pulmonary nodule.  Etiology of his ILD is unclear but it has not changed significantly on serial imaging and has been asymptomatic. He wears CPAP reliably, can fall asleep when at rest during the day. He has albuterol   He complains today of low energy. He has SOB with yard work, significant exertion. He has put on about 10 lbs since last year. Several yrs ago was 170 lbs. He is concerned that his memory has worsened.   Underwent repeat PFT 08/12/2019 that I reviewed, show mixed obstruction and restriction, curve to his flow/volume loop. Normal lung volumes (? Pseudonormalized), normal DLCO.    Review of Systems As per HPI  Past Medical History:  Diagnosis Date  . Allergic rhinitis   . Anemia   . Arthritis   . Asthma    no problems recently  . Colon polyp   . Decreased hearing   . Dupuytren contracture   . ED (erectile dysfunction)   . Edema    takes hydrochlorothiazide  . Heart murmur    hx yrs ago  . Hypercholesterolemia   . Hypertension   . Low testosterone   . Nephritis    78 yrs old  hospitalized. no problem since  . Peyronie's disease 06/2004  . Polymyalgia rheumatica (HCC)      Family History  Problem Relation Age of Onset  . Alzheimer's disease Mother   . Lung cancer Father   . Emphysema Father   . Liver cancer Father   . Asthma Grandchild   . Colon cancer Neg Hx   . Rheumatologic disease Neg Hx      Social History   Socioeconomic History  . Marital status: Married    Spouse name: Not on file  . Number of children: 2  . Years of education: Not on file  . Highest education level: Not  on file  Occupational History  . Occupation: Press photographer  Social Needs  . Financial resource strain: Not on file  . Food insecurity    Worry: Not on file    Inability: Not on file  . Transportation needs    Medical: Not on file    Non-medical: Not on file  Tobacco Use  . Smoking status: Former Smoker    Packs/day: 2.00    Years: 21.00    Pack years: 42.00    Types: Cigarettes    Start date: 04/24/1954    Quit date: 05/28/1975    Years since quitting: 44.4  . Smokeless tobacco: Never Used  Substance and Sexual Activity  . Alcohol use: No  . Drug use: No  . Sexual activity: Not on file    Comment: MARRIED  Lifestyle  . Physical activity    Days per week: Not on file    Minutes per session: Not on file  . Stress: Not on file  Relationships  . Social Herbalist on phone: Not on file    Gets together: Not on file    Attends religious service: Not on file    Active member of club or organization: Not on file  Attends meetings of clubs or organizations: Not on file    Relationship status: Not on file  . Intimate partner violence    Fear of current or ex partner: Not on file    Emotionally abused: Not on file    Physically abused: Not on file    Forced sexual activity: Not on file  Other Topics Concern  . Not on file  Social History Narrative   [facility] (06/03/17):   Originally from [facility]. He has only lived [place] & [place] only. 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 Has also traveled to [place]. Previously worked for [employer] in [employer]. 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 No known mold or asbestos exposure. 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 No pets currently. No bird exposure. No hot tub exposure.      Allergies  Allergen Reactions  . Sulfa Antibiotics     Questionable allergy  . Sulfamethoxazole Other (See Comments)     Family history of allergy - patient never took.  Marland Kitchen Amoxicillin Rash    Has patient had a PCN reaction causing immediate rash, facial/tongue/throat swelling, SOB or lightheadedness with hypotension: Yes Has patient had a PCN reaction causing severe rash involving mucus membranes or skin necrosis: No Has patient had a PCN reaction that required hospitalization: No Has patient had a PCN reaction occurring within the last 10 years: No If all of the above answers are "NO", then may proceed with Cephalosporin use.      Outpatient Medications Prior to Visit  Medication Sig Dispense Refill  . acetaminophen (TYLENOL) 650 MG CR tablet Take 1,300 mg by mouth every 8 (eight) hours as needed for pain.    Marland Kitchen albuterol (PROVENTIL HFA;VENTOLIN HFA) 108 (90 BASE) MCG/ACT inhaler Inhale 2 puffs into the lungs every 4 (four) hours as needed for shortness of breath.     Marland Kitchen atorvastatin (LIPITOR) 40 MG tablet Take 20 mg by mouth daily.    . beta carotene w/minerals (OCUVITE) tablet Take 1 tablet by mouth daily.    . cetirizine (KLS ALLER-TEC) 10 MG tablet Take 10 mg by mouth daily.    . cholecalciferol (VITAMIN D) 25 MCG (1000 UT) tablet Take 1,000 Units by mouth daily.    . ferrous sulfate 325 (65 FE) MG tablet Take 325 mg by mouth daily with breakfast.    . fluticasone (FLONASE) 50 MCG/ACT nasal spray Place 2 sprays into the nose daily as needed for rhinitis or allergies.     . hydrochlorothiazide (HYDRODIURIL) 25 MG tablet Take 12.5 mg by mouth daily.     . Hypertonic Nasal Wash (SINUS RINSE NA) Place 1 Dose into the nose daily as needed (congestion).    Marland Kitchen levothyroxine (SYNTHROID) 137 MCG tablet     . mometasone (NASONEX) 50 MCG/ACT nasal spray Place 2 sprays into the nose 2 (two) times daily as needed (allergies).     No facility-administered medications prior to visit.         Objective:   Physical Exam Vitals:   11/01/19 1507  BP: 122/66  Pulse: 74  SpO2: 96%  Weight: 210 lb (95.3 kg)  Height:  5\' 10"  (1.778 m)   Gen: Pleasant, well-nourished, in no distress,  normal affect  ENT: No lesions,  mouth clear,  oropharynx clear, no postnasal drip  Neck: No JVD, no stridor  Lungs: No use of accessory muscles,  no crackles or wheezing on normal respiration, no wheeze on forced expiration  Cardiovascular: RRR, heart sounds normal, no murmur or gallops, no peripheral edema  Musculoskeletal: No deformities, no cyanosis or clubbing  Neuro: alert, awake, non focal  Skin: Warm, no lesions or rash     Assessment & Plan:  Fatigue Question whether there may be some crossover between this and his dyspnea. Need to rule out occult desaturation.   SOB (shortness of breath) Multifactorial with what sounds like impact of his known interstitial disease, suspected coexisting obstruction that is currently untreated (does not use his albuterol), possibly some deconditioning and also changes with aging.  Most straightforward component to address would be possible obstructive lung disease.  I will do a trial on scheduled bronchodilator therapy to see if he gets benefit.  If so then we could possibly continue, consider using albuterol as needed more reliably.  OSA on CPAP Good compliance with his CPAP.  70% usage for over 4 hours.  Sometimes naps during the day but overall good clinical response.  Pressure is set at 9 cm water.  ILD (interstitial lung disease) ([place]) Plan to repeat his high-resolution CT scan of the chest to look for interval change, stability.  Walking oximetry today.  Baltazar Apo, MD, PhD 11/01/2019, 5:55 PM [facility] [phone] or if no answer [phone]

## 2019-12-05 DIAGNOSIS — H903 Sensorineural hearing loss, bilateral: Secondary | ICD-10-CM | POA: Diagnosis not present

## 2019-12-07 ENCOUNTER — Ambulatory Visit (INDEPENDENT_AMBULATORY_CARE_PROVIDER_SITE_OTHER): Payer: Medicare Other | Admitting: Emergency Medicine

## 2019-12-07 ENCOUNTER — Other Ambulatory Visit: Payer: Self-pay

## 2019-12-07 ENCOUNTER — Encounter: Payer: Self-pay | Admitting: Emergency Medicine

## 2019-12-07 DIAGNOSIS — J849 Interstitial pulmonary disease, unspecified: Secondary | ICD-10-CM

## 2019-12-07 DIAGNOSIS — G4733 Obstructive sleep apnea (adult) (pediatric): Secondary | ICD-10-CM | POA: Diagnosis not present

## 2019-12-07 DIAGNOSIS — Z9989 Dependence on other enabling machines and devices: Secondary | ICD-10-CM | POA: Diagnosis not present

## 2019-12-07 DIAGNOSIS — R0602 Shortness of breath: Secondary | ICD-10-CM | POA: Diagnosis not present

## 2019-12-07 NOTE — Assessment & Plan Note (Signed)
Compliant, continue same

## 2019-12-07 NOTE — Patient Instructions (Addendum)
Agree with stopping Spiriva. You can keep albuterol available to use 2 puffs if needed for shortness of breath Continue to work on some exercise every day Continue your CPAP every night as you have been doing it. We will perform your high-resolution CT scan of the chest early, changed from September 2021. Follow with Dr. Lamonte Sakai next available after your CT scan to review the results together.

## 2019-12-07 NOTE — Assessment & Plan Note (Signed)
He did not seem to get any benefit from the addition of Spiriva.  We will stop it.  I suspect that his progressive dyspnea over the last year is either related to deconditioning or some progression of interstitial disease.  We had planned initially to get his repeat CT in September (the 2-year mark) but we will perform earlier to ensure no evidence of progression.  If so that he might be a candidate for antifibrotic therapy.  If his CT is stable then I suspect that we need to work on his cardiopulmonary conditioning as a primary cause of his progressive shortness of breath.

## 2019-12-07 NOTE — Assessment & Plan Note (Signed)
We will perform his CT scan early, review next time.

## 2019-12-07 NOTE — Progress Notes (Signed)
Subjective:    Patient ID: Shane Cook, male    DOB: [birth year], 79 y.o.   MRN: [id number]  HPI 79 year old gentleman, former smoker (23 pack years) who is been followed in our office by Dr. Ashok Cordia and then subsequently by Dr. Lake Bells for interstitial lung disease in an NSIP pattern, obstructive sleep apnea.  He has a history of polymyalgia rheumatica, right upper lobe pulmonary nodule.  Etiology of his ILD is unclear but it has not changed significantly on serial imaging and has been asymptomatic. He wears CPAP reliably, can fall asleep when at rest during the day. He has albuterol   He complains today of low energy. He has SOB with yard work, significant exertion. He has put on about 10 lbs since last year. Several yrs ago was 170 lbs. He is concerned that his memory has worsened.   Underwent repeat PFT 08/12/2019 that I reviewed, show mixed obstruction and restriction, curve to his flow/volume loop. Normal lung volumes (? Pseudonormalized), normal DLCO.   ROV 12/07/19 --79 year old former smoker with ILD/NSIP, OSA, mixed obstruction and restriction w presumed COPD.  At his last visit in December he was having persistent, progressive dyspnea.  He did not desaturate with ambulation.  We added Spiriva Respimat to see if he would get any benefit.  Today he reports that he has taken it reliably, hasn't noticed any change in his breathing.    Review of Systems As per HPI  Past Medical History:  Diagnosis Date  . Allergic rhinitis   . Anemia   . Arthritis   . Asthma    no problems recently  . Colon polyp   . Decreased hearing   . Dupuytren contracture   . ED (erectile dysfunction)   . Edema    takes hydrochlorothiazide  . Heart murmur    hx yrs ago  . Hypercholesterolemia   . Hypertension   . Low testosterone   . Nephritis    79 yrs old  hospitalized. no problem since  . Peyronie's disease 06/2004  . Polymyalgia rheumatica (HCC)      Family History  Problem Relation Age of  Onset  . Alzheimer's disease Mother   . Lung cancer Father   . Emphysema Father   . Liver cancer Father   . Asthma Grandchild   . Colon cancer Neg Hx   . Rheumatologic disease Neg Hx      Social History   Socioeconomic History  . Marital status: Married    Spouse name: Not on file  . Number of children: 2  . Years of education: Not on file  . Highest education level: Not on file  Occupational History  . Occupation: Sales  Tobacco Use  . Smoking status: Former Smoker    Packs/day: 2.00    Years: 21.00    Pack years: 42.00    Types: Cigarettes    Start date: 04/24/1954    Quit date: 05/28/1975    Years since quitting: 44.5  . Smokeless tobacco: Never Used  Substance and Sexual Activity  . Alcohol use: No  . Drug use: No  . Sexual activity: Not on file    Comment: MARRIED  Other Topics Concern  . Not on file  Social History Narrative   [facility] (06/03/17):   Originally from [facility]. He has only lived [place] & [place] only. 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 Has also traveled to [place]. Previously worked for [employer] in [employer]. 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 No known mold or asbestos exposure. 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 No pets currently. No bird exposure. No hot tub exposure.    Social Determinants of Health   Financial Resource Strain:   . Difficulty of Paying Living Expenses: Not on file  Food Insecurity:   . Worried About Charity fundraiser in the Last Year: Not on file  . Ran Out of Food in the Last Year: Not on file  Transportation Needs:   . Lack of Transportation (Medical): Not on file  . Lack of Transportation (Non-Medical): Not on file  Physical Activity:   . Days of Exercise per Week: Not on file  . Minutes of Exercise per Session: Not on file  Stress:   . Feeling of Stress : Not on file  Social  Connections:   . Frequency of Communication with Friends and Family: Not on file  . Frequency of Social Gatherings with Friends and Family: Not on file  . Attends Religious Services: Not on file  . Active Member of Clubs or Organizations: Not on file  . Attends Archivist Meetings: Not on file  . Marital Status: Not on file  Intimate Partner Violence:   . Fear of Current or Ex-Partner: Not on file  . Emotionally Abused: Not on file  . Physically Abused: Not on file  . Sexually Abused: Not on file     Allergies  Allergen Reactions  . Sulfa Antibiotics     Questionable allergy  . Sulfamethoxazole Other (See Comments)    Family history of allergy - patient never took.  Marland Kitchen Amoxicillin Rash    Has patient had a PCN reaction causing immediate rash, facial/tongue/throat swelling, SOB or lightheadedness with hypotension: Yes Has patient had a PCN reaction causing severe rash involving mucus membranes or skin necrosis: No Has patient had a PCN reaction that required hospitalization: No Has patient had a PCN reaction occurring within the last 10 years: No If all of the above answers are "NO", then may proceed with Cephalosporin use.      Outpatient Medications Prior to Visit  Medication Sig Dispense Refill  . acetaminophen (TYLENOL) 650 MG CR tablet Take 1,300 mg by mouth every 8 (eight) hours as needed for pain.    Marland Kitchen atorvastatin (LIPITOR) 40 MG tablet Take 20 mg by mouth daily.    . beta carotene w/minerals (OCUVITE) tablet Take 1 tablet by mouth daily.    . cetirizine (KLS ALLER-TEC) 10 MG tablet Take 10 mg by mouth daily.    . cholecalciferol (VITAMIN D) 25 MCG (1000 UT) tablet Take 1,000 Units by mouth daily.    . ferrous sulfate 325 (65 FE) MG tablet Take 325 mg by mouth daily with breakfast.    . hydrochlorothiazide (HYDRODIURIL) 25 MG tablet Take 12.5 mg by mouth daily.     . Hypertonic Nasal Wash (SINUS RINSE NA) Place 1 Dose into the nose daily as needed (congestion).     Marland Kitchen levothyroxine (SYNTHROID) 137 MCG tablet     . Tiotropium Bromide Monohydrate (SPIRIVA RESPIMAT) 2.5 MCG/ACT AERS Inhale 2 puffs into the lungs daily. 8 g 0  . albuterol (PROVENTIL HFA;VENTOLIN HFA) 108 (90 BASE) MCG/ACT inhaler Inhale 2 puffs into the lungs every 4 (four) hours as needed for shortness of breath.     . fluticasone (FLONASE) 50 MCG/ACT nasal spray Place 2 sprays into the  nose daily as needed for rhinitis or allergies.     . mometasone (NASONEX) 50 MCG/ACT nasal spray Place 2 sprays into the nose 2 (two) times daily as needed (allergies).     No facility-administered medications prior to visit.        Objective:   Physical Exam Vitals:   12/07/19 1041  BP: 124/64  Pulse: 72  Temp: (!) 97 F (36.1 C)  TempSrc: Temporal  SpO2: 98%  Weight: 211 lb 12.8 oz (96.1 kg)  Height: 5\' 10"  (1.778 m)   Gen: Pleasant, well-nourished, in no distress,  normal affect  ENT: No lesions,  mouth clear,  oropharynx clear, no postnasal drip  Neck: No JVD, no stridor  Lungs: No use of accessory muscles, no crackles or wheezing on normal respiration, no wheeze on forced expiration  Cardiovascular: RRR, heart sounds normal, no murmur or gallops, no peripheral edema  Musculoskeletal: No deformities, no cyanosis or clubbing  Neuro: alert, awake, non focal  Skin: Warm, no lesions or rash     Assessment & Plan:  OSA on CPAP Compliant, continue same  SOB (shortness of breath) He did not seem to get any benefit from the addition of Spiriva.  We will stop it.  I suspect that his progressive dyspnea over the last year is either related to deconditioning or some progression of interstitial disease.  We had planned initially to get his repeat CT in September (the 2-year mark) but we will perform earlier to ensure no evidence of progression.  If so that he might be a candidate for antifibrotic therapy.  If his CT is stable then I suspect that we need to work on his cardiopulmonary  conditioning as a primary cause of his progressive shortness of breath.  ILD (interstitial lung disease) ([place]) We will perform his CT scan early, review next time.   Baltazar Apo, MD, PhD 12/07/2019, 11:04 AM [facility] [phone] or if no answer [phone]

## 2019-12-16 ENCOUNTER — Ambulatory Visit
Admission: RE | Admit: 2019-12-16 | Discharge: 2019-12-16 | Disposition: A | Discharge status: Home / Self Care | Payer: Medicare Other | Source: Ambulatory Visit | Attending: Emergency Medicine | Admitting: Emergency Medicine

## 2019-12-16 DIAGNOSIS — R918 Other nonspecific abnormal finding of lung field: Secondary | ICD-10-CM | POA: Diagnosis not present

## 2019-12-16 DIAGNOSIS — J849 Interstitial pulmonary disease, unspecified: Secondary | ICD-10-CM

## 2019-12-20 DIAGNOSIS — J449 Chronic obstructive pulmonary disease, unspecified: Secondary | ICD-10-CM | POA: Diagnosis not present

## 2019-12-20 DIAGNOSIS — I10 Essential (primary) hypertension: Secondary | ICD-10-CM | POA: Diagnosis not present

## 2019-12-20 DIAGNOSIS — N183 Chronic kidney disease, stage 3 unspecified: Secondary | ICD-10-CM | POA: Diagnosis not present

## 2019-12-20 DIAGNOSIS — E7800 Pure hypercholesterolemia, unspecified: Secondary | ICD-10-CM | POA: Diagnosis not present

## 2019-12-20 DIAGNOSIS — E039 Hypothyroidism, unspecified: Secondary | ICD-10-CM | POA: Diagnosis not present

## 2019-12-20 DIAGNOSIS — D649 Anemia, unspecified: Secondary | ICD-10-CM | POA: Diagnosis not present

## 2019-12-22 ENCOUNTER — Ambulatory Visit: Payer: Medicare Other

## 2019-12-31 ENCOUNTER — Ambulatory Visit: Payer: Medicare Other

## 2020-01-04 DIAGNOSIS — H35433 Paving stone degeneration of retina, bilateral: Secondary | ICD-10-CM | POA: Diagnosis not present

## 2020-01-04 DIAGNOSIS — H43813 Vitreous degeneration, bilateral: Secondary | ICD-10-CM | POA: Diagnosis not present

## 2020-01-04 DIAGNOSIS — H26491 Other secondary cataract, right eye: Secondary | ICD-10-CM | POA: Diagnosis not present

## 2020-01-04 DIAGNOSIS — H353133 Nonexudative age-related macular degeneration, bilateral, advanced atrophic without subfoveal involvement: Secondary | ICD-10-CM | POA: Diagnosis not present

## 2020-01-05 ENCOUNTER — Ambulatory Visit (INDEPENDENT_AMBULATORY_CARE_PROVIDER_SITE_OTHER): Payer: Medicare Other | Admitting: Emergency Medicine

## 2020-01-05 ENCOUNTER — Other Ambulatory Visit: Payer: Self-pay

## 2020-01-05 ENCOUNTER — Encounter: Payer: Self-pay | Admitting: Emergency Medicine

## 2020-01-05 VITALS — BP 150/82 | HR 68 | Ht 69.0 in | Wt 211.0 lb

## 2020-01-05 DIAGNOSIS — J849 Interstitial pulmonary disease, unspecified: Secondary | ICD-10-CM | POA: Diagnosis not present

## 2020-01-05 DIAGNOSIS — Z9989 Dependence on other enabling machines and devices: Secondary | ICD-10-CM | POA: Diagnosis not present

## 2020-01-05 DIAGNOSIS — J449 Chronic obstructive pulmonary disease, unspecified: Secondary | ICD-10-CM

## 2020-01-05 DIAGNOSIS — G4733 Obstructive sleep apnea (adult) (pediatric): Secondary | ICD-10-CM | POA: Diagnosis not present

## 2020-01-05 HISTORY — DX: Chronic obstructive pulmonary disease, unspecified: J44.9

## 2020-01-05 MED ORDER — ALBUTEROL SULFATE HFA 108 (90 BASE) MCG/ACT IN AERS
1.0000 | INHALATION_SPRAY | RESPIRATORY_TRACT | 3 refills | Status: AC | PRN
  Filled 2023-12-18: qty 6.7, 17d supply, fill #0

## 2020-01-05 NOTE — Assessment & Plan Note (Signed)
Good compliance.  He is not sure how much he benefits clinically but he is willing to continue to use CPAP.  Plan to continue same

## 2020-01-05 NOTE — Assessment & Plan Note (Signed)
Mixed obstruction and restriction noted on his prior PFT.  He did not benefit significantly from Spiriva but we will continue to keep albuterol available to use if he has acute symptoms.

## 2020-01-05 NOTE — Patient Instructions (Addendum)
Please continue to wear your CPAP mask reliably every night. Keep albuterol available use 2 puffs if needed for shortness of breath, chest tightness, wheezing.  We will refill this for you today. We will not start any scheduled inhaler medication right now. We will follow your chest x-ray and CT scan of the chest over time.  We can discuss the timing of the next scan at your follow-up visit. Blood work today. Follow with Dr Lamonte Sakai in 6 months or sooner if you have any problems

## 2020-01-05 NOTE — Assessment & Plan Note (Signed)
His CT chest done on 12/16/2019 is stable going back to 2018.  He has some mild mediastinal lymphadenopathy peripheral groundglass change in absence of any honeycombing or bronchiectasis, not in a UIP pattern, question NSIP although not classic for this either.  He has not had a hypersensitivity pneumonitis panel and I think we should do this.  He may have had water damage and a mold exposure 3 to 4 years ago.  We will discuss the timing of repeat imaging depending on his clinical progression.  Suspect we can wait 1 year.

## 2020-01-05 NOTE — Progress Notes (Signed)
Subjective:    Patient ID: Shane Cook, male    DOB: 10/12/1941, 79 y.o.   MRN: [id number]  HPI  ROV 01/05/2020- --79 year old gentleman with a history of ILD/NSIP, mixed obstruction and restriction by pulmonary function testing. Auto-immune labs negative. I do not see that an HSP panel has been done. He recalls some water damage in his home after a water line backed up - about 4 yrs ago. It was fixed and he doesn't know of any mold.  I started him on Spiriva in the setting of persistent dyspnea but it did not change his breathing significantly so he stopped it.  Based on that I repeated his CT chest on 12/16/2019, reviewed today which shows chronic prominent patchy air trapping in both lungs with mild patchy groundglass opacity in absence of honeycombing bronchiectasis, no significant progression since prior scan in 2018.  There was some mild mediastinal lymphadenopathy seen.  His CPAP download today shows that he uses his mask 67% of the nights, 50% of the nights were greater than 4 hours, pressure 9 cmH2O.  Good control of his events. He is unsure whether he benefits significantly.    Review of Systems As per HPI  Past Medical History:  Diagnosis Date  . Allergic rhinitis   . Anemia   . Arthritis   . Asthma    no problems recently  . Colon polyp   . Decreased hearing   . Dupuytren contracture   . ED (erectile dysfunction)   . Edema    takes hydrochlorothiazide  . Heart murmur    hx yrs ago  . Hypercholesterolemia   . Hypertension   . Low testosterone   . Nephritis    79 yrs old  hospitalized. no problem since 79 yrs old  . Peyronie's disease 06/2004  . Polymyalgia rheumatica (HCC)      Family History  Problem Relation Age of Onset  . Alzheimer's disease Mother   . Lung cancer Father   . Emphysema Father   . Liver cancer Father   . Asthma Grandchild   . Colon cancer Neg Hx   . Rheumatologic disease Neg Hx      Social History   Socioeconomic History  . Marital status:  Married    Spouse name: Not on file  . Number of children: 2  . Years of education: Not on file  . Highest education level: Not on file  Occupational History  . Occupation: Sales  Tobacco Use  . Smoking status: Former Smoker    Packs/day: 2.00    Years: 21.00    Pack years: 42.00    Types: Cigarettes    Start date: 04/24/1954    Quit date: 05/28/1975    Years since quitting: 44.6  . Smokeless tobacco: Never Used  Substance and Sexual Activity  . Alcohol use: No  . Drug use: No  . Sexual activity: Not on file    Comment: MARRIED  Other Topics Concern  . Not on file  Social History Narrative   [facility] (06/03/17):   Originally from [facility]. He has only lived [place] & [place] only. 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 Has also traveled to [place]. Previously worked for [employer] in [employer]. 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 No known mold or asbestos exposure. 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 No pets currently. No bird exposure. No hot tub exposure.  Social Determinants of Health   Financial Resource Strain:   . Difficulty of Paying Living Expenses: Not on file  Food Insecurity:   . Worried About Charity fundraiser in the Last Year: Not on file  . Ran Out of Food in the Last Year: Not on file  Transportation Needs:   . Lack of Transportation (Medical): Not on file  . Lack of Transportation (Non-Medical): Not on file  Physical Activity:   . Days of Exercise per Week: Not on file  . Minutes of Exercise per Session: Not on file  Stress:   . Feeling of Stress : Not on file  Social Connections:   . Frequency of Communication with Friends and Family: Not on file  . Frequency of Social Gatherings with Friends and Family: Not on file  . Attends Religious Services: Not on file  . Active Member of Clubs or Organizations: Not on file  .  Attends Archivist Meetings: Not on file  . Marital Status: Not on file  Intimate Partner Violence:   . Fear of Current or Ex-Partner: Not on file  . Emotionally Abused: Not on file  . Physically Abused: Not on file  . Sexually Abused: Not on file     Allergies  Allergen Reactions  . Sulfa Antibiotics     Questionable allergy  . Sulfamethoxazole Other (See Comments)    Family history of allergy - patient never took.  Marland Kitchen Amoxicillin Rash    Has patient had a PCN reaction causing immediate rash, facial/tongue/throat swelling, SOB or lightheadedness with hypotension: Yes Has patient had a PCN reaction causing severe rash involving mucus membranes or skin necrosis: No Has patient had a PCN reaction that required hospitalization: No Has patient had a PCN reaction occurring within the last 10 years: No If all of the above answers are "NO", then may proceed with Cephalosporin use.      Outpatient Medications Prior to Visit  Medication Sig Dispense Refill  . acetaminophen (TYLENOL) 650 MG CR tablet Take 1,300 mg by mouth every 8 (eight) hours as needed for pain.    Marland Kitchen atorvastatin (LIPITOR) 40 MG tablet Take 20 mg by mouth daily.    . beta carotene w/minerals (OCUVITE) tablet Take 1 tablet by mouth daily.    . cetirizine (KLS ALLER-TEC) 10 MG tablet Take 10 mg by mouth daily.    . cholecalciferol (VITAMIN D) 25 MCG (1000 UT) tablet Take 1,000 Units by mouth daily.    . ferrous sulfate 325 (65 FE) MG tablet Take 325 mg by mouth daily with breakfast.    . hydrochlorothiazide (HYDRODIURIL) 25 MG tablet Take 12.5 mg by mouth daily.     . Hypertonic Nasal Wash (SINUS RINSE NA) Place 1 Dose into the nose daily as needed (congestion).    Marland Kitchen levothyroxine (SYNTHROID) 125 MCG tablet Take 125 mcg by mouth daily before breakfast.    . albuterol (PROVENTIL HFA;VENTOLIN HFA) 108 (90 BASE) MCG/ACT inhaler Inhale 2 puffs into the lungs every 4 (four) hours as needed for shortness of breath.     .  levothyroxine (SYNTHROID) 137 MCG tablet     . fluticasone (FLONASE) 50 MCG/ACT nasal spray Place 2 sprays into the nose daily as needed for rhinitis or allergies.     . mometasone (NASONEX) 50 MCG/ACT nasal spray Place 2 sprays into the nose 2 (two) times daily as needed (allergies).    . Tiotropium Bromide Monohydrate (SPIRIVA RESPIMAT) 2.5 MCG/ACT AERS Inhale 2 puffs into  the lungs daily. 8 g 0   No facility-administered medications prior to visit.        Objective:   Physical Exam Vitals:   01/05/20 0912  BP: (!) 150/82  Pulse: 68  SpO2: 95%  Weight: 211 lb (95.7 kg)  Height: 5\' 9"  (1.753 m)   Gen: Pleasant, well-nourished, in no distress,  normal affect  ENT: No lesions,  mouth clear,  oropharynx clear, no postnasal drip  Neck: No JVD, no stridor  Lungs: No use of accessory muscles, no crackles or wheezing on normal respiration, no wheeze on forced expiration  Cardiovascular: RRR, heart sounds normal, no murmur or gallops, no peripheral edema  Musculoskeletal: No deformities, no cyanosis or clubbing  Neuro: alert, awake, non focal  Skin: Warm, no lesions or rash     Assessment & Plan:  ILD (interstitial lung disease) ([place]) His CT chest done on 12/16/2019 is stable going back to 2018.  He has some mild mediastinal lymphadenopathy peripheral groundglass change in absence of any honeycombing or bronchiectasis, not in a UIP pattern, question NSIP although not classic for this either.  He has not had a hypersensitivity pneumonitis panel and I think we should do this.  He may have had water damage and a mold exposure 3 to 4 years ago.  We will discuss the timing of repeat imaging depending on his clinical progression.  Suspect we can wait 1 year.  OSA on CPAP Good compliance.  He is not sure how much he benefits clinically but he is willing to continue to use CPAP.  Plan to continue same  Obstructive lung disease (generalized) ([place]) Mixed obstruction and restriction noted  on his prior PFT.  He did not benefit significantly from Spiriva but we will continue to keep albuterol available to use if he has acute symptoms.  Baltazar Apo, MD, PhD 01/05/2020, 3:00 PM [facility] [phone] or if no answer [phone]

## 2020-01-10 ENCOUNTER — Telehealth: Payer: Self-pay | Admitting: *Deleted

## 2020-01-10 ENCOUNTER — Encounter: Payer: Self-pay | Admitting: *Deleted

## 2020-01-10 NOTE — Telephone Encounter (Signed)

## 2020-01-10 NOTE — Telephone Encounter (Signed)
This encounter was created in error - please disregard.

## 2020-01-10 NOTE — Progress Notes (Signed)
Virtual Visit via Telehpone Note   This visit type was conducted due to national recommendations for restrictions regarding the COVID-19 Pandemic (e.g. social distancing) in an effort to limit this patient's exposure and mitigate transmission in our community.  Due to his co-morbid illnesses, this patient is at least at moderate risk for complications without adequate follow up.  This format is felt to be most appropriate for this patient at this time.  All issues noted in this document were discussed and addressed.  A limited physical exam was performed with this format.  Please refer to the patient's chart for his consent to telehealth for [facility].   Evaluation Performed:  Follow-up visit  This visit type was conducted due to national recommendations for restrictions regarding the COVID-19 Pandemic (e.g. social distancing).  This format is felt to be most appropriate for this patient at this time.  All issues noted in this document were discussed and addressed.  No physical exam was performed (except for noted visual exam findings with Video Visits).  Please refer to the patient's chart (MyChart message for video visits and phone note for telephone visits) for the patient's consent to telehealth for [facility].  Date:  01/11/2020   ID:  Shane Cook, DOB 09-13-1941, MRN [id number]  Patient Location:  Home  Provider location:   [place]  PCP:  Lawerance Cruel, MD  Sleep Medicine:  Fransico Him, MD Electrophysiologist:  None   Chief Complaint:  OSA  History of Present Illness:    Shane Cook is a 79 y.o. male who presents via audio/video conferencing for a telehealth visit today.    Shane Cook is a 79 y.o. male with a hx of mild OSA with an AHI of 13.9/hr and oxygen desaturations as low as 83% and underwent CPAP titration to 9cm H2O. He is doing well with his CPAP device and thinks that he has gotten used to it.  He tolerates the nasal pillow mask and feels  the pressure is adequate.  Since going on CPAP he feels rested in the am and has no significant daytime sleepiness.  He denies any significant mouth or nasal dryness or nasal congestion.  He does not think that he snores.    The patient does not have symptoms concerning for COVID-19 infection (fever, chills, cough, or new shortness of breath).   Prior CV studies:   The following studies were reviewed today:  PAP compliance download on Airview  Past Medical History:  Diagnosis Date   Allergic rhinitis    Anemia    Arthritis    Asthma    no problems recently   Colon polyp    Decreased hearing    Dupuytren contracture    ED (erectile dysfunction)    Edema    takes hydrochlorothiazide   Heart murmur    hx yrs ago   Hypercholesterolemia    Hypertension    Low testosterone    Nephritis    79 yrs old  hospitalized. no problem since   Peyronie's disease 06/2004   Polymyalgia rheumatica ([place])    Past Surgical History:  Procedure Laterality Date   ACHILLES TENDON REPAIR Right    CATARACT EXTRACTION     bilateral   HERNIA REPAIR     KNEE ARTHROSCOPY     left   LEFT HEART CATH AND CORONARY ANGIOGRAPHY N/A 05/04/2017   Procedure: Left Heart Cath and Coronary Angiography;  Surgeon: Belva Crome, MD;  Location: [facility] INVASIVE CV  LAB;  Service: Cardiovascular;  Laterality: N/A;   MOUTH SURGERY     cyst 79 yrs old ?ethmoid   peyronies  08?   penis bent/   ROTATOR CUFF REPAIR     bilat 20 years apart   TONSILLECTOMY     TOTAL KNEE ARTHROPLASTY Left 08/30/2013   Dr Rhona Raider   TOTAL KNEE ARTHROPLASTY Left 08/30/2013   Procedure: TOTAL KNEE ARTHROPLASTY;  Surgeon: Hessie Dibble, MD;  Location: [facility];  Service: Orthopedics;  Laterality: Left;     Current Meds  Medication Sig   acetaminophen (TYLENOL) 650 MG CR tablet Take 1,300 mg by mouth 2 (two) times daily.    albuterol (VENTOLIN HFA) 108 (90 Base) MCG/ACT inhaler Inhale 2 puffs into the lungs every  4 (four) hours as needed for shortness of breath.   atorvastatin (LIPITOR) 40 MG tablet Take 20 mg by mouth daily.   beta carotene w/minerals (OCUVITE) tablet Take 1 tablet by mouth daily.   cetirizine (KLS ALLER-TEC) 10 MG tablet Take 10 mg by mouth daily.   cholecalciferol (VITAMIN D) 25 MCG (1000 UT) tablet Take 1,000 Units by mouth daily.   ferrous sulfate 325 (65 FE) MG tablet Take 325 mg by mouth daily with breakfast.   hydrochlorothiazide (HYDRODIURIL) 25 MG tablet Take 12.5 mg by mouth daily.    Hypertonic Nasal Wash (SINUS RINSE NA) Place 1 Dose into the nose daily as needed (congestion).   levothyroxine (SYNTHROID) 125 MCG tablet Take 137 mcg by mouth daily before breakfast.      Allergies:   Sulfa antibiotics, Sulfamethoxazole, and Amoxicillin   Social History   Tobacco Use   Smoking status: Former Smoker    Packs/day: 2.00    Years: 21.00    Pack years: 42.00    Types: Cigarettes    Start date: 04/24/1954    Quit date: 05/28/1975    Years since quitting: 44.6   Smokeless tobacco: Never Used  Substance Use Topics   Alcohol use: No   Drug use: No     Family Hx: The patient's family history includes Alzheimer's disease in his mother; Asthma in his grandchild; Emphysema in his father; Liver cancer in his father; Lung cancer in his father. There is no history of Colon cancer or Rheumatologic disease.  ROS:   Please see the history of present illness.     All other systems reviewed and are negative.   Labs/Other Tests and Data Reviewed:    Recent Labs: No results found for requested labs within last 8760 hours.   Recent Lipid Panel No results found for: CHOL, TRIG, HDL, CHOLHDL, LDLCALC, LDLDIRECT  Wt Readings from Last 3 Encounters:  01/11/20 210 lb (95.3 kg)  01/05/20 211 lb (95.7 kg)  12/07/19 211 lb 12.8 oz (96.1 kg)     Objective:    Vital Signs:  BP 131/72    Pulse 73    Wt 210 lb (95.3 kg)    BMI 31.01 kg/m     ASSESSMENT & PLAN:    1.   OSA - The patient is tolerating PAP therapy well without any problems. The PAP download was reviewed today and showed an AHI of 0.6/hr on 9 cm H2O with 47% compliance in using more than 4 hours nightly.  The patient has been using and benefiting from PAP use and will continue to benefit from therapy. He will continue on current CPAP settings.  I encouraged him to be more compliant with his device.  2.  Obesity -his BMI is elevated at 31 -I have encouraged him to get into a routine exercise program and cut back on carbs and portions.   3.  HTN -BP controlled -outside labs from PCP reviewed on [facility] and showed Creatinine 1.23 and K+ 3.9 in July 2020 -continue HCTZ 12.5mg  daily   COVID-19 Education: The signs and symptoms of COVID-19 were discussed with the patient and how to seek care for testing (follow up with PCP or arrange E-visit).  The importance of social distancing was discussed today.  Patient Risk:   After full review of this patient's clinical status, I feel that they are at least moderate risk at this time.  Time:   Today, I have spent 20 minutes on telemedicine discussing medical problems including OSA and obesity and reviewing patient's chart including PAP compliance download on Airview.  Medication Adjustments/Labs and Tests Ordered: Current medicines are reviewed at length with the patient today.  Concerns regarding medicines are outlined above.  Tests Ordered: No orders of the defined types were placed in this encounter.  Medication Changes: No orders of the defined types were placed in this encounter.   Disposition:  Follow up in 1 year(s)  Signed, Fransico Him, MD  01/11/2020 2:37 PM    [place]

## 2020-01-11 ENCOUNTER — Other Ambulatory Visit: Payer: Self-pay

## 2020-01-11 ENCOUNTER — Encounter: Payer: Self-pay | Admitting: Cardiology

## 2020-01-11 ENCOUNTER — Telehealth (INDEPENDENT_AMBULATORY_CARE_PROVIDER_SITE_OTHER): Payer: Medicare Other | Admitting: Cardiology

## 2020-01-11 VITALS — BP 131/72 | HR 73 | Wt 210.0 lb

## 2020-01-11 DIAGNOSIS — I10 Essential (primary) hypertension: Secondary | ICD-10-CM | POA: Diagnosis not present

## 2020-01-11 DIAGNOSIS — E669 Obesity, unspecified: Secondary | ICD-10-CM

## 2020-01-11 DIAGNOSIS — Z9989 Dependence on other enabling machines and devices: Secondary | ICD-10-CM | POA: Diagnosis not present

## 2020-01-11 DIAGNOSIS — G4733 Obstructive sleep apnea (adult) (pediatric): Secondary | ICD-10-CM

## 2020-01-11 LAB — HYPERSENSITIVITY PNUEMONITIS PROFILE
ASPERGILLUS FUMIGATUS: NEGATIVE
Faenia retivirgula: NEGATIVE
Pigeon Serum: NEGATIVE
S. VIRIDIS: NEGATIVE
T. CANDIDUS: NEGATIVE
T. VULGARIS: NEGATIVE

## 2020-01-11 NOTE — Patient Instructions (Signed)

## 2020-02-21 DIAGNOSIS — D649 Anemia, unspecified: Secondary | ICD-10-CM | POA: Diagnosis not present

## 2020-02-21 DIAGNOSIS — E039 Hypothyroidism, unspecified: Secondary | ICD-10-CM | POA: Diagnosis not present

## 2020-02-21 DIAGNOSIS — J449 Chronic obstructive pulmonary disease, unspecified: Secondary | ICD-10-CM | POA: Diagnosis not present

## 2020-02-21 DIAGNOSIS — E7800 Pure hypercholesterolemia, unspecified: Secondary | ICD-10-CM | POA: Diagnosis not present

## 2020-02-21 DIAGNOSIS — N183 Chronic kidney disease, stage 3 unspecified: Secondary | ICD-10-CM | POA: Diagnosis not present

## 2020-02-21 DIAGNOSIS — I10 Essential (primary) hypertension: Secondary | ICD-10-CM | POA: Diagnosis not present

## 2020-04-04 DIAGNOSIS — Z8639 Personal history of other endocrine, nutritional and metabolic disease: Secondary | ICD-10-CM | POA: Diagnosis not present

## 2020-04-04 DIAGNOSIS — R42 Dizziness and giddiness: Secondary | ICD-10-CM | POA: Diagnosis not present

## 2020-04-18 DIAGNOSIS — E7800 Pure hypercholesterolemia, unspecified: Secondary | ICD-10-CM | POA: Diagnosis not present

## 2020-04-18 DIAGNOSIS — J449 Chronic obstructive pulmonary disease, unspecified: Secondary | ICD-10-CM | POA: Diagnosis not present

## 2020-04-18 DIAGNOSIS — E039 Hypothyroidism, unspecified: Secondary | ICD-10-CM | POA: Diagnosis not present

## 2020-04-18 DIAGNOSIS — N183 Chronic kidney disease, stage 3 unspecified: Secondary | ICD-10-CM | POA: Diagnosis not present

## 2020-04-18 DIAGNOSIS — I10 Essential (primary) hypertension: Secondary | ICD-10-CM | POA: Diagnosis not present

## 2020-04-18 DIAGNOSIS — D649 Anemia, unspecified: Secondary | ICD-10-CM | POA: Diagnosis not present

## 2020-06-19 DIAGNOSIS — D649 Anemia, unspecified: Secondary | ICD-10-CM | POA: Diagnosis not present

## 2020-06-19 DIAGNOSIS — E039 Hypothyroidism, unspecified: Secondary | ICD-10-CM | POA: Diagnosis not present

## 2020-06-19 DIAGNOSIS — I10 Essential (primary) hypertension: Secondary | ICD-10-CM | POA: Diagnosis not present

## 2020-06-19 DIAGNOSIS — E7800 Pure hypercholesterolemia, unspecified: Secondary | ICD-10-CM | POA: Diagnosis not present

## 2020-06-26 DIAGNOSIS — R251 Tremor, unspecified: Secondary | ICD-10-CM | POA: Diagnosis not present

## 2020-06-26 DIAGNOSIS — E039 Hypothyroidism, unspecified: Secondary | ICD-10-CM | POA: Diagnosis not present

## 2020-06-26 DIAGNOSIS — Z0000 Encounter for general adult medical examination without abnormal findings: Secondary | ICD-10-CM | POA: Diagnosis not present

## 2020-06-26 DIAGNOSIS — I10 Essential (primary) hypertension: Secondary | ICD-10-CM | POA: Diagnosis not present

## 2020-06-26 DIAGNOSIS — N183 Chronic kidney disease, stage 3 unspecified: Secondary | ICD-10-CM | POA: Diagnosis not present

## 2020-06-26 DIAGNOSIS — J449 Chronic obstructive pulmonary disease, unspecified: Secondary | ICD-10-CM | POA: Diagnosis not present

## 2020-06-26 DIAGNOSIS — D649 Anemia, unspecified: Secondary | ICD-10-CM | POA: Diagnosis not present

## 2020-06-26 DIAGNOSIS — E7800 Pure hypercholesterolemia, unspecified: Secondary | ICD-10-CM | POA: Diagnosis not present

## 2020-07-10 DIAGNOSIS — H43813 Vitreous degeneration, bilateral: Secondary | ICD-10-CM | POA: Diagnosis not present

## 2020-07-10 DIAGNOSIS — H353113 Nonexudative age-related macular degeneration, right eye, advanced atrophic without subfoveal involvement: Secondary | ICD-10-CM | POA: Diagnosis not present

## 2020-07-10 DIAGNOSIS — H353124 Nonexudative age-related macular degeneration, left eye, advanced atrophic with subfoveal involvement: Secondary | ICD-10-CM | POA: Diagnosis not present

## 2020-07-10 DIAGNOSIS — H35433 Paving stone degeneration of retina, bilateral: Secondary | ICD-10-CM | POA: Diagnosis not present

## 2020-07-16 DIAGNOSIS — E291 Testicular hypofunction: Secondary | ICD-10-CM | POA: Diagnosis not present

## 2020-07-16 DIAGNOSIS — N5201 Erectile dysfunction due to arterial insufficiency: Secondary | ICD-10-CM | POA: Diagnosis not present

## 2020-07-16 DIAGNOSIS — N401 Enlarged prostate with lower urinary tract symptoms: Secondary | ICD-10-CM | POA: Diagnosis not present

## 2020-07-16 DIAGNOSIS — R3914 Feeling of incomplete bladder emptying: Secondary | ICD-10-CM | POA: Diagnosis not present

## 2020-08-28 ENCOUNTER — Ambulatory Visit: Payer: Medicare Other | Attending: Internal Medicine

## 2020-08-28 DIAGNOSIS — Z23 Encounter for immunization: Secondary | ICD-10-CM

## 2020-08-28 NOTE — Progress Notes (Signed)
   Covid-19 Vaccination Clinic  Name:  Shane Cook    MRN: [id number] DOB: 06-04-1941  08/28/2020  Shane Cook was observed post Covid-19 immunization for 15 minutes without incident. He was provided with Vaccine Information Sheet and instruction to access the V-Safe system.   Shane Cook was instructed to call 911 with any severe reactions post vaccine: Marland Kitchen Difficulty breathing  . Swelling of face and throat  . A fast heartbeat  . A bad rash all over body  . Dizziness and weakness

## 2020-09-04 DIAGNOSIS — D225 Melanocytic nevi of trunk: Secondary | ICD-10-CM | POA: Diagnosis not present

## 2020-09-04 DIAGNOSIS — L821 Other seborrheic keratosis: Secondary | ICD-10-CM | POA: Diagnosis not present

## 2020-09-04 DIAGNOSIS — B078 Other viral warts: Secondary | ICD-10-CM | POA: Diagnosis not present

## 2020-09-04 DIAGNOSIS — L820 Inflamed seborrheic keratosis: Secondary | ICD-10-CM | POA: Diagnosis not present

## 2020-10-02 DIAGNOSIS — D649 Anemia, unspecified: Secondary | ICD-10-CM | POA: Diagnosis not present

## 2020-10-02 DIAGNOSIS — Z23 Encounter for immunization: Secondary | ICD-10-CM | POA: Diagnosis not present

## 2020-10-02 DIAGNOSIS — R251 Tremor, unspecified: Secondary | ICD-10-CM | POA: Diagnosis not present

## 2020-10-02 DIAGNOSIS — E7800 Pure hypercholesterolemia, unspecified: Secondary | ICD-10-CM | POA: Diagnosis not present

## 2020-10-02 DIAGNOSIS — E039 Hypothyroidism, unspecified: Secondary | ICD-10-CM | POA: Diagnosis not present

## 2020-10-02 DIAGNOSIS — J449 Chronic obstructive pulmonary disease, unspecified: Secondary | ICD-10-CM | POA: Diagnosis not present

## 2020-10-02 DIAGNOSIS — N183 Chronic kidney disease, stage 3 unspecified: Secondary | ICD-10-CM | POA: Diagnosis not present

## 2020-10-02 DIAGNOSIS — Z0000 Encounter for general adult medical examination without abnormal findings: Secondary | ICD-10-CM | POA: Diagnosis not present

## 2020-10-02 DIAGNOSIS — I10 Essential (primary) hypertension: Secondary | ICD-10-CM | POA: Diagnosis not present

## 2020-10-09 DIAGNOSIS — H353113 Nonexudative age-related macular degeneration, right eye, advanced atrophic without subfoveal involvement: Secondary | ICD-10-CM | POA: Diagnosis not present

## 2020-10-09 DIAGNOSIS — H353124 Nonexudative age-related macular degeneration, left eye, advanced atrophic with subfoveal involvement: Secondary | ICD-10-CM | POA: Diagnosis not present

## 2020-10-16 DIAGNOSIS — E7800 Pure hypercholesterolemia, unspecified: Secondary | ICD-10-CM | POA: Diagnosis not present

## 2020-10-16 DIAGNOSIS — D649 Anemia, unspecified: Secondary | ICD-10-CM | POA: Diagnosis not present

## 2020-10-16 DIAGNOSIS — E039 Hypothyroidism, unspecified: Secondary | ICD-10-CM | POA: Diagnosis not present

## 2020-10-16 DIAGNOSIS — N183 Chronic kidney disease, stage 3 unspecified: Secondary | ICD-10-CM | POA: Diagnosis not present

## 2020-10-16 DIAGNOSIS — I10 Essential (primary) hypertension: Secondary | ICD-10-CM | POA: Diagnosis not present

## 2020-10-16 DIAGNOSIS — J449 Chronic obstructive pulmonary disease, unspecified: Secondary | ICD-10-CM | POA: Diagnosis not present

## 2020-12-05 DIAGNOSIS — M25571 Pain in right ankle and joints of right foot: Secondary | ICD-10-CM | POA: Diagnosis not present

## 2021-01-01 DIAGNOSIS — E039 Hypothyroidism, unspecified: Secondary | ICD-10-CM | POA: Diagnosis not present

## 2021-01-08 DIAGNOSIS — H353133 Nonexudative age-related macular degeneration, bilateral, advanced atrophic without subfoveal involvement: Secondary | ICD-10-CM | POA: Diagnosis not present

## 2021-01-08 DIAGNOSIS — H43813 Vitreous degeneration, bilateral: Secondary | ICD-10-CM | POA: Diagnosis not present

## 2021-01-08 DIAGNOSIS — H35433 Paving stone degeneration of retina, bilateral: Secondary | ICD-10-CM | POA: Diagnosis not present

## 2021-01-10 ENCOUNTER — Ambulatory Visit: Payer: Medicare Other | Admitting: Cardiology

## 2021-01-23 DIAGNOSIS — N183 Chronic kidney disease, stage 3 unspecified: Secondary | ICD-10-CM | POA: Diagnosis not present

## 2021-01-23 DIAGNOSIS — E039 Hypothyroidism, unspecified: Secondary | ICD-10-CM | POA: Diagnosis not present

## 2021-01-23 DIAGNOSIS — D649 Anemia, unspecified: Secondary | ICD-10-CM | POA: Diagnosis not present

## 2021-01-23 DIAGNOSIS — I10 Essential (primary) hypertension: Secondary | ICD-10-CM | POA: Diagnosis not present

## 2021-01-23 DIAGNOSIS — J449 Chronic obstructive pulmonary disease, unspecified: Secondary | ICD-10-CM | POA: Diagnosis not present

## 2021-01-23 DIAGNOSIS — E7800 Pure hypercholesterolemia, unspecified: Secondary | ICD-10-CM | POA: Diagnosis not present

## 2021-02-18 DIAGNOSIS — H31093 Other chorioretinal scars, bilateral: Secondary | ICD-10-CM | POA: Diagnosis not present

## 2021-02-18 DIAGNOSIS — H353133 Nonexudative age-related macular degeneration, bilateral, advanced atrophic without subfoveal involvement: Secondary | ICD-10-CM | POA: Diagnosis not present

## 2021-02-18 DIAGNOSIS — D3131 Benign neoplasm of right choroid: Secondary | ICD-10-CM | POA: Diagnosis not present

## 2021-02-18 DIAGNOSIS — H35033 Hypertensive retinopathy, bilateral: Secondary | ICD-10-CM | POA: Diagnosis not present

## 2021-02-18 DIAGNOSIS — H35371 Puckering of macula, right eye: Secondary | ICD-10-CM | POA: Diagnosis not present

## 2021-04-15 DIAGNOSIS — E7800 Pure hypercholesterolemia, unspecified: Secondary | ICD-10-CM | POA: Diagnosis not present

## 2021-04-15 DIAGNOSIS — J449 Chronic obstructive pulmonary disease, unspecified: Secondary | ICD-10-CM | POA: Diagnosis not present

## 2021-04-15 DIAGNOSIS — I10 Essential (primary) hypertension: Secondary | ICD-10-CM | POA: Diagnosis not present

## 2021-04-15 DIAGNOSIS — N183 Chronic kidney disease, stage 3 unspecified: Secondary | ICD-10-CM | POA: Diagnosis not present

## 2021-04-15 DIAGNOSIS — D649 Anemia, unspecified: Secondary | ICD-10-CM | POA: Diagnosis not present

## 2021-04-15 DIAGNOSIS — E039 Hypothyroidism, unspecified: Secondary | ICD-10-CM | POA: Diagnosis not present

## 2021-04-18 DIAGNOSIS — D649 Anemia, unspecified: Secondary | ICD-10-CM | POA: Diagnosis not present

## 2021-04-30 DIAGNOSIS — J449 Chronic obstructive pulmonary disease, unspecified: Secondary | ICD-10-CM | POA: Diagnosis not present

## 2021-04-30 DIAGNOSIS — E039 Hypothyroidism, unspecified: Secondary | ICD-10-CM | POA: Diagnosis not present

## 2021-04-30 DIAGNOSIS — N183 Chronic kidney disease, stage 3 unspecified: Secondary | ICD-10-CM | POA: Diagnosis not present

## 2021-04-30 DIAGNOSIS — D649 Anemia, unspecified: Secondary | ICD-10-CM | POA: Diagnosis not present

## 2021-04-30 DIAGNOSIS — I10 Essential (primary) hypertension: Secondary | ICD-10-CM | POA: Diagnosis not present

## 2021-04-30 DIAGNOSIS — E7800 Pure hypercholesterolemia, unspecified: Secondary | ICD-10-CM | POA: Diagnosis not present

## 2021-07-01 ENCOUNTER — Emergency Department (HOSPITAL_COMMUNITY)
Admission: EM | Admit: 2021-07-01 | Discharge: 2021-07-02 | Disposition: A | Discharge status: Home / Self Care | Payer: No Typology Code available for payment source | Attending: Emergency Medicine | Admitting: Emergency Medicine

## 2021-07-01 ENCOUNTER — Emergency Department (HOSPITAL_COMMUNITY): Payer: No Typology Code available for payment source

## 2021-07-01 ENCOUNTER — Encounter (HOSPITAL_COMMUNITY): Payer: Self-pay | Admitting: *Deleted

## 2021-07-01 DIAGNOSIS — R079 Chest pain, unspecified: Secondary | ICD-10-CM | POA: Diagnosis not present

## 2021-07-01 DIAGNOSIS — S3991XA Unspecified injury of abdomen, initial encounter: Secondary | ICD-10-CM | POA: Insufficient documentation

## 2021-07-01 DIAGNOSIS — I959 Hypotension, unspecified: Secondary | ICD-10-CM | POA: Diagnosis not present

## 2021-07-01 DIAGNOSIS — I10 Essential (primary) hypertension: Secondary | ICD-10-CM | POA: Insufficient documentation

## 2021-07-01 DIAGNOSIS — Z87891 Personal history of nicotine dependence: Secondary | ICD-10-CM | POA: Insufficient documentation

## 2021-07-01 DIAGNOSIS — Z79899 Other long term (current) drug therapy: Secondary | ICD-10-CM | POA: Insufficient documentation

## 2021-07-01 DIAGNOSIS — M545 Low back pain, unspecified: Secondary | ICD-10-CM | POA: Diagnosis not present

## 2021-07-01 DIAGNOSIS — Z041 Encounter for examination and observation following transport accident: Secondary | ICD-10-CM | POA: Diagnosis not present

## 2021-07-01 DIAGNOSIS — S2232XA Fracture of one rib, left side, initial encounter for closed fracture: Secondary | ICD-10-CM | POA: Diagnosis not present

## 2021-07-01 DIAGNOSIS — R0789 Other chest pain: Secondary | ICD-10-CM | POA: Diagnosis not present

## 2021-07-01 DIAGNOSIS — S299XXA Unspecified injury of thorax, initial encounter: Secondary | ICD-10-CM | POA: Diagnosis present

## 2021-07-01 DIAGNOSIS — Z955 Presence of coronary angioplasty implant and graft: Secondary | ICD-10-CM | POA: Insufficient documentation

## 2021-07-01 DIAGNOSIS — R519 Headache, unspecified: Secondary | ICD-10-CM | POA: Diagnosis not present

## 2021-07-01 DIAGNOSIS — Z96652 Presence of left artificial knee joint: Secondary | ICD-10-CM | POA: Insufficient documentation

## 2021-07-01 DIAGNOSIS — R001 Bradycardia, unspecified: Secondary | ICD-10-CM | POA: Diagnosis not present

## 2021-07-01 DIAGNOSIS — E039 Hypothyroidism, unspecified: Secondary | ICD-10-CM | POA: Diagnosis not present

## 2021-07-01 DIAGNOSIS — Y92410 Unspecified street and highway as the place of occurrence of the external cause: Secondary | ICD-10-CM | POA: Insufficient documentation

## 2021-07-01 DIAGNOSIS — J45909 Unspecified asthma, uncomplicated: Secondary | ICD-10-CM | POA: Diagnosis not present

## 2021-07-01 DIAGNOSIS — N183 Chronic kidney disease, stage 3 unspecified: Secondary | ICD-10-CM | POA: Diagnosis not present

## 2021-07-01 DIAGNOSIS — Z0000 Encounter for general adult medical examination without abnormal findings: Secondary | ICD-10-CM | POA: Diagnosis not present

## 2021-07-01 DIAGNOSIS — E7800 Pure hypercholesterolemia, unspecified: Secondary | ICD-10-CM | POA: Diagnosis not present

## 2021-07-01 DIAGNOSIS — R251 Tremor, unspecified: Secondary | ICD-10-CM | POA: Diagnosis not present

## 2021-07-01 DIAGNOSIS — J449 Chronic obstructive pulmonary disease, unspecified: Secondary | ICD-10-CM | POA: Diagnosis not present

## 2021-07-01 DIAGNOSIS — Z1389 Encounter for screening for other disorder: Secondary | ICD-10-CM | POA: Diagnosis not present

## 2021-07-01 DIAGNOSIS — D649 Anemia, unspecified: Secondary | ICD-10-CM | POA: Diagnosis not present

## 2021-07-01 DIAGNOSIS — M542 Cervicalgia: Secondary | ICD-10-CM | POA: Diagnosis not present

## 2021-07-01 LAB — COMPREHENSIVE METABOLIC PANEL
ALT: 21 U/L (ref 0–44)
AST: 24 U/L (ref 15–41)
Albumin: 4.3 g/dL (ref 3.5–5.0)
Alkaline Phosphatase: 88 U/L (ref 38–126)
Anion gap: 9 (ref 5–15)
BUN: 20 mg/dL (ref 8–23)
CO2: 30 mmol/L (ref 22–32)
Calcium: 9.2 mg/dL (ref 8.9–10.3)
Chloride: 101 mmol/L (ref 98–111)
Creatinine, Ser: 1.28 mg/dL — ABNORMAL HIGH (ref 0.61–1.24)
GFR, Estimated: 56 mL/min — ABNORMAL LOW (ref >60)
Glucose, Bld: 123 mg/dL — ABNORMAL HIGH (ref 70–99)
Potassium: 3.7 mmol/L (ref 3.5–5.1)
Sodium: 140 mmol/L (ref 135–145)
Total Bilirubin: 0.9 mg/dL (ref 0.3–1.2)
Total Protein: 7.1 g/dL (ref 6.5–8.1)

## 2021-07-01 LAB — CBC
HCT: 43.2 % (ref 39.0–52.0)
Hemoglobin: 14 g/dL (ref 13.0–17.0)
MCH: 30.4 pg (ref 26.0–34.0)
MCHC: 32.4 g/dL (ref 30.0–36.0)
MCV: 93.9 fL (ref 80.0–100.0)
Platelets: 248 10*3/uL (ref 150–400)
RBC: 4.6 MIL/uL (ref 4.22–5.81)
RDW: 13.2 % (ref 11.5–15.5)
WBC: 13.1 10*3/uL — ABNORMAL HIGH (ref 4.0–10.5)
nRBC: 0 % (ref 0.0–0.2)

## 2021-07-01 MED ORDER — IOHEXOL 350 MG/ML SOLN
80.0000 mL | Freq: Once | INTRAVENOUS | Status: AC | PRN
  Administered 2021-07-01: 80 mL via INTRAVENOUS

## 2021-07-01 MED ORDER — FENTANYL CITRATE (PF) 100 MCG/2ML IJ SOLN
25.0000 ug | Freq: Once | INTRAMUSCULAR | Status: DC
  Filled 2021-07-01: qty 2
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
## 2021-07-02 MED ORDER — ACETAMINOPHEN 500 MG PO TABS
1000.0000 mg | ORAL_TABLET | Freq: Once | ORAL | Status: AC
  Administered 2021-07-02: 1000 mg via ORAL
  Filled 2021-07-02: qty 2

## 2021-07-02 NOTE — Discharge Instructions (Addendum)
You were evaluated in the Emergency Department and after careful evaluation, we did not find any emergent condition requiring admission or further testing in the hospital.  Your exam/testing today was overall reassuring.  CT scans showed a single broken rib.  Please take deep breaths throughout the day as we discussed.  Can use Tylenol up to 1000 mg every 4-6 hours for pain.  There is a 24-hour [facility] here in [place] on the corner of [address].  Please return to the Emergency Department if you experience any worsening of your condition.  Thank you for allowing [place] to be a part of your care.
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
## 2021-07-04 DIAGNOSIS — N183 Chronic kidney disease, stage 3 unspecified: Secondary | ICD-10-CM | POA: Diagnosis not present

## 2021-07-04 DIAGNOSIS — E039 Hypothyroidism, unspecified: Secondary | ICD-10-CM | POA: Diagnosis not present

## 2021-07-04 DIAGNOSIS — Z8739 Personal history of other diseases of the musculoskeletal system and connective tissue: Secondary | ICD-10-CM | POA: Diagnosis not present

## 2021-07-04 DIAGNOSIS — R52 Pain, unspecified: Secondary | ICD-10-CM | POA: Diagnosis not present

## 2021-07-04 DIAGNOSIS — E7800 Pure hypercholesterolemia, unspecified: Secondary | ICD-10-CM | POA: Diagnosis not present

## 2021-07-04 DIAGNOSIS — D649 Anemia, unspecified: Secondary | ICD-10-CM | POA: Diagnosis not present

## 2021-07-04 DIAGNOSIS — J449 Chronic obstructive pulmonary disease, unspecified: Secondary | ICD-10-CM | POA: Diagnosis not present

## 2021-07-04 DIAGNOSIS — I10 Essential (primary) hypertension: Secondary | ICD-10-CM | POA: Diagnosis not present

## 2021-07-10 DIAGNOSIS — D3131 Benign neoplasm of right choroid: Secondary | ICD-10-CM | POA: Diagnosis not present

## 2021-07-10 DIAGNOSIS — H35433 Paving stone degeneration of retina, bilateral: Secondary | ICD-10-CM | POA: Diagnosis not present

## 2021-07-10 DIAGNOSIS — H43813 Vitreous degeneration, bilateral: Secondary | ICD-10-CM | POA: Diagnosis not present

## 2021-07-10 DIAGNOSIS — H353133 Nonexudative age-related macular degeneration, bilateral, advanced atrophic without subfoveal involvement: Secondary | ICD-10-CM | POA: Diagnosis not present

## 2021-07-16 DIAGNOSIS — N401 Enlarged prostate with lower urinary tract symptoms: Secondary | ICD-10-CM | POA: Diagnosis not present

## 2021-07-24 DIAGNOSIS — J449 Chronic obstructive pulmonary disease, unspecified: Secondary | ICD-10-CM | POA: Diagnosis not present

## 2021-07-24 DIAGNOSIS — E039 Hypothyroidism, unspecified: Secondary | ICD-10-CM | POA: Diagnosis not present

## 2021-07-24 DIAGNOSIS — N183 Chronic kidney disease, stage 3 unspecified: Secondary | ICD-10-CM | POA: Diagnosis not present

## 2021-07-24 DIAGNOSIS — R351 Nocturia: Secondary | ICD-10-CM | POA: Diagnosis not present

## 2021-07-24 DIAGNOSIS — R972 Elevated prostate specific antigen [PSA]: Secondary | ICD-10-CM | POA: Diagnosis not present

## 2021-07-24 DIAGNOSIS — N401 Enlarged prostate with lower urinary tract symptoms: Secondary | ICD-10-CM | POA: Diagnosis not present

## 2021-07-24 DIAGNOSIS — N5201 Erectile dysfunction due to arterial insufficiency: Secondary | ICD-10-CM | POA: Diagnosis not present

## 2021-07-24 DIAGNOSIS — E291 Testicular hypofunction: Secondary | ICD-10-CM | POA: Diagnosis not present

## 2021-07-24 DIAGNOSIS — E7800 Pure hypercholesterolemia, unspecified: Secondary | ICD-10-CM | POA: Diagnosis not present

## 2021-07-24 DIAGNOSIS — I10 Essential (primary) hypertension: Secondary | ICD-10-CM | POA: Diagnosis not present

## 2021-07-24 DIAGNOSIS — D649 Anemia, unspecified: Secondary | ICD-10-CM | POA: Diagnosis not present

## 2021-09-17 DIAGNOSIS — L820 Inflamed seborrheic keratosis: Secondary | ICD-10-CM | POA: Diagnosis not present

## 2021-09-17 DIAGNOSIS — D1801 Hemangioma of skin and subcutaneous tissue: Secondary | ICD-10-CM | POA: Diagnosis not present

## 2021-09-17 DIAGNOSIS — D485 Neoplasm of uncertain behavior of skin: Secondary | ICD-10-CM | POA: Diagnosis not present

## 2021-09-17 DIAGNOSIS — L821 Other seborrheic keratosis: Secondary | ICD-10-CM | POA: Diagnosis not present

## 2021-09-18 DIAGNOSIS — H5213 Myopia, bilateral: Secondary | ICD-10-CM | POA: Diagnosis not present

## 2021-09-18 DIAGNOSIS — Z961 Presence of intraocular lens: Secondary | ICD-10-CM | POA: Diagnosis not present

## 2021-09-18 DIAGNOSIS — H43813 Vitreous degeneration, bilateral: Secondary | ICD-10-CM | POA: Diagnosis not present

## 2021-09-18 DIAGNOSIS — H353134 Nonexudative age-related macular degeneration, bilateral, advanced atrophic with subfoveal involvement: Secondary | ICD-10-CM | POA: Diagnosis not present

## 2021-09-21 DIAGNOSIS — Z23 Encounter for immunization: Secondary | ICD-10-CM | POA: Diagnosis not present

## 2021-11-14 ENCOUNTER — Encounter: Payer: Self-pay | Admitting: Neurology

## 2021-11-14 DIAGNOSIS — M353 Polymyalgia rheumatica: Secondary | ICD-10-CM | POA: Diagnosis not present

## 2021-11-14 DIAGNOSIS — R251 Tremor, unspecified: Secondary | ICD-10-CM | POA: Diagnosis not present

## 2021-11-14 DIAGNOSIS — R079 Chest pain, unspecified: Secondary | ICD-10-CM | POA: Diagnosis not present
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
## 2021-12-02 ENCOUNTER — Ambulatory Visit (INDEPENDENT_AMBULATORY_CARE_PROVIDER_SITE_OTHER): Payer: Medicare Other | Admitting: Neurology

## 2021-12-02 ENCOUNTER — Other Ambulatory Visit: Payer: Self-pay

## 2021-12-02 ENCOUNTER — Encounter: Payer: Self-pay | Admitting: Neurology

## 2021-12-02 ENCOUNTER — Other Ambulatory Visit (INDEPENDENT_AMBULATORY_CARE_PROVIDER_SITE_OTHER): Payer: Medicare Other

## 2021-12-02 VITALS — BP 116/62 | HR 77 | Ht 70.0 in | Wt 189.4 lb

## 2021-12-02 DIAGNOSIS — Z5181 Encounter for therapeutic drug level monitoring: Secondary | ICD-10-CM

## 2021-12-02 DIAGNOSIS — E538 Deficiency of other specified B group vitamins: Secondary | ICD-10-CM

## 2021-12-02 DIAGNOSIS — R413 Other amnesia: Secondary | ICD-10-CM

## 2021-12-02 DIAGNOSIS — R251 Tremor, unspecified: Secondary | ICD-10-CM | POA: Diagnosis not present

## 2021-12-02 LAB — VITAMIN B12: Vitamin B-12: 510 pg/mL (ref 211–911)

## 2021-12-02 NOTE — Patient Instructions (Signed)
Your provider has requested that you have labwork completed today. The lab is located on the Second floor at [address], within the [facility] office. When you get off the elevator, turn right and go in the [facility] [address]; the first brown door on the left.  Tell the ladies behind the desk that you are there for lab work. If you are not called within 15 minutes please check with the front desk.   Once you complete your labs you are free to go. You will receive a call or message via MyChart with your lab results.    You have been referred for a neurocognitive evaluation (i.e., evaluation of memory and thinking abilities). Please bring someone with you to this appointment if possible, as it is helpful for the neuropsychologist to hear from both you and another adult who knows you well. Please bring eyeglasses and hearing aids if you wear them and take any medications as you normally would.    The evaluation will take approximately 2-3 hours and has two parts:   The first part is a clinical interview with the neuropsychologist, Dr. Merz.  During the interview, the neuropsychologist will speak with you and the individual you brought to the appointment.    The second part of the evaluation is testing with the doctor's technician, aka psychometrician, Dana or Kim. During the testing, the technician will ask you to remember different types of material, solve problems, and answer some questionnaires. Your family member will not be present for this portion of the evaluation.   Please note: We have to reserve several hours of the neuropsychologist's time and the psychometrician's time for your evaluation appointment. As such, there is a No-Show fee of $100. If you are unable to attend any of your appointments, please contact our office as soon as possible to reschedule.  

## 2021-12-03 LAB — RPR: RPR Ser Ql: NONREACTIVE

## 2021-12-04 ENCOUNTER — Ambulatory Visit (INDEPENDENT_AMBULATORY_CARE_PROVIDER_SITE_OTHER): Payer: Medicare Other | Admitting: Psychology

## 2021-12-04 ENCOUNTER — Encounter: Payer: Self-pay | Admitting: Psychology

## 2021-12-04 ENCOUNTER — Ambulatory Visit: Payer: Medicare Other

## 2021-12-04 ENCOUNTER — Other Ambulatory Visit: Payer: Self-pay

## 2021-12-04 DIAGNOSIS — M353 Polymyalgia rheumatica: Secondary | ICD-10-CM | POA: Insufficient documentation

## 2021-12-04 DIAGNOSIS — J449 Chronic obstructive pulmonary disease, unspecified: Secondary | ICD-10-CM | POA: Insufficient documentation

## 2021-12-04 DIAGNOSIS — D126 Benign neoplasm of colon, unspecified: Secondary | ICD-10-CM | POA: Insufficient documentation

## 2021-12-04 DIAGNOSIS — N183 Chronic kidney disease, stage 3 unspecified: Secondary | ICD-10-CM | POA: Insufficient documentation

## 2021-12-04 DIAGNOSIS — J301 Allergic rhinitis due to pollen: Secondary | ICD-10-CM | POA: Insufficient documentation

## 2021-12-04 DIAGNOSIS — D649 Anemia, unspecified: Secondary | ICD-10-CM | POA: Insufficient documentation

## 2021-12-04 DIAGNOSIS — G3184 Mild cognitive impairment, so stated: Secondary | ICD-10-CM | POA: Diagnosis not present

## 2021-12-04 DIAGNOSIS — R4189 Other symptoms and signs involving cognitive functions and awareness: Secondary | ICD-10-CM

## 2021-12-04 DIAGNOSIS — N529 Male erectile dysfunction, unspecified: Secondary | ICD-10-CM | POA: Insufficient documentation

## 2021-12-04 DIAGNOSIS — E039 Hypothyroidism, unspecified: Secondary | ICD-10-CM | POA: Insufficient documentation

## 2021-12-04 HISTORY — DX: Mild cognitive impairment of uncertain or unknown etiology: G31.84

## 2021-12-04 NOTE — Progress Notes (Signed)
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 and three additional units [id number]. Psychometrist Cruzita Lederer, B.S., assisted Dr. Melvyn Novas with test administration and scoring procedures. As a separate and discrete service, Dr. Melvyn Novas spent a total of 160 minutes in interpretation and report writing billed as one unit [phone] and two units [id number].

## 2021-12-04 NOTE — Progress Notes (Signed)
° °  Psychometrician Note   Cognitive testing was administered to Shane Cook by Cruzita Lederer, B.S. (psychometrist) under the supervision of Dr. Christia Reading, Ph.D., licensed psychologist on 12/04/2021. Shane Cook did not appear overtly distressed by the testing session per behavioral observation or responses across self-report questionnaires. Rest breaks were offered.    The battery of tests administered was selected by Dr. Christia Reading, Ph.D. with consideration to Shane Cook current level of functioning, the nature of his symptoms, emotional and behavioral responses during interview, level of literacy, observed level of motivation/effort, and the nature of the referral question. This battery was communicated to the psychometrist. Communication between Dr. Christia Reading, Ph.D. and the psychometrist was ongoing throughout the evaluation and Dr. Christia Reading, Ph.D. was immediately accessible at all times. Dr. Christia Reading, Ph.D. provided supervision to the psychometrist on the date of this service to the extent necessary to assure the quality of all services provided.    Shane Cook will return within approximately 1-2 weeks for an interactive feedback session with Dr. Melvyn Novas at which time his test performances, clinical impressions, and treatment recommendations will be reviewed in detail. Shane Cook understands he can contact our office should he require our assistance before this time.  A total of 130 minutes of billable time were spent face-to-face with Shane Cook by the psychometrist. This includes both test administration and scoring time. Billing for these services is reflected in the clinical report generated by Dr. Christia Reading, Ph.D.  This note reflects time spent with the psychometrician and does not include test scores or any clinical interpretations made by Dr. Melvyn Novas. The full report will follow in a separate note.

## 2021-12-11 ENCOUNTER — Other Ambulatory Visit: Payer: Self-pay

## 2021-12-11 ENCOUNTER — Ambulatory Visit (INDEPENDENT_AMBULATORY_CARE_PROVIDER_SITE_OTHER): Payer: Medicare Other | Admitting: Psychology

## 2021-12-11 DIAGNOSIS — G3184 Mild cognitive impairment, so stated: Secondary | ICD-10-CM | POA: Diagnosis not present

## 2021-12-11 NOTE — Progress Notes (Signed)
° °  Neuropsychology Feedback Session Tillie Rung. [facility]  Reason for Referral:   Shane Cook is a 81 y.o. right-handed Caucasian male referred by Alonza Bogus, D.O., to characterize his current cognitive functioning and assist with diagnostic clarity and treatment planning in the context of subjective cognitive decline.  Feedback:   Shane Cook completed a comprehensive neuropsychological evaluation on 12/04/2021. Please refer to that encounter for the full report and recommendations. Briefly, results suggested a primary weakness surrounding encoding (i.e., learning) and retrieval aspects of memory, with greater impairment exhibited across the latter facet. Performance across recognition/consolidation aspects of memory were consistently strong. Outside of this, all other assessed cognitive domains were appropriate relative to age-matched peers. His pattern of memory dysfunction is not consistent with Alzheimer's disease. Behaviorally, he does not display common features of Lewy body dementia or frontotemporal dementia. Dr. Carles Collet did not express strong concerns surrounding Parkinson's disease and I do not have evidence to warrant greater concern surrounding this or a more rare parkinsonian condition. Encoding and retrieval deficits can be seen as a result of various medical conditions (e.g., chronic kidney disease, thyroid dysfunction, obstructive sleep apnea, hypertension, hyperlipidemia). Symptoms of polymyalgia and side effects of taking Prednisone could also affect aspects of memory in a negative manner. Continued medical monitoring will be important moving forward.   Shane Cook was accompanied by his wife during the current feedback session. Content of the current session focused on the results of his neuropsychological evaluation. Shane Cook was given the opportunity to ask questions and his questions were answered. He was encouraged to reach out should  additional questions arise. A copy of his report was provided at the conclusion of the visit.      25 minutes were spent conducting the current feedback session with Shane Cook, billed as one unit [phone].

## 2021-12-12 DIAGNOSIS — N183 Chronic kidney disease, stage 3 unspecified: Secondary | ICD-10-CM | POA: Diagnosis not present

## 2021-12-12 DIAGNOSIS — M2560 Stiffness of unspecified joint, not elsewhere classified: Secondary | ICD-10-CM | POA: Diagnosis not present

## 2021-12-12 DIAGNOSIS — J449 Chronic obstructive pulmonary disease, unspecified: Secondary | ICD-10-CM | POA: Diagnosis not present

## 2021-12-12 DIAGNOSIS — I10 Essential (primary) hypertension: Secondary | ICD-10-CM | POA: Diagnosis not present

## 2021-12-12 DIAGNOSIS — D649 Anemia, unspecified: Secondary | ICD-10-CM | POA: Diagnosis not present

## 2021-12-12 DIAGNOSIS — E7800 Pure hypercholesterolemia, unspecified: Secondary | ICD-10-CM | POA: Diagnosis not present

## 2021-12-12 DIAGNOSIS — E039 Hypothyroidism, unspecified: Secondary | ICD-10-CM | POA: Diagnosis not present

## 2021-12-12 DIAGNOSIS — Z09 Encounter for follow-up examination after completed treatment for conditions other than malignant neoplasm: Secondary | ICD-10-CM | POA: Diagnosis not present

## 2022-01-01 DIAGNOSIS — E039 Hypothyroidism, unspecified: Secondary | ICD-10-CM | POA: Diagnosis not present

## 2022-01-08 DIAGNOSIS — D3131 Benign neoplasm of right choroid: Secondary | ICD-10-CM | POA: Diagnosis not present

## 2022-01-08 DIAGNOSIS — H43813 Vitreous degeneration, bilateral: Secondary | ICD-10-CM | POA: Diagnosis not present

## 2022-01-08 DIAGNOSIS — H353133 Nonexudative age-related macular degeneration, bilateral, advanced atrophic without subfoveal involvement: Secondary | ICD-10-CM | POA: Diagnosis not present

## 2022-01-08 DIAGNOSIS — H35433 Paving stone degeneration of retina, bilateral: Secondary | ICD-10-CM | POA: Diagnosis not present

## 2022-01-14 DIAGNOSIS — R972 Elevated prostate specific antigen [PSA]: Secondary | ICD-10-CM | POA: Diagnosis not present

## 2022-04-29 DIAGNOSIS — R197 Diarrhea, unspecified: Secondary | ICD-10-CM | POA: Diagnosis not present

## 2022-04-29 DIAGNOSIS — Z20822 Contact with and (suspected) exposure to covid-19: Secondary | ICD-10-CM | POA: Diagnosis not present

## 2022-04-29 DIAGNOSIS — R1110 Vomiting, unspecified: Secondary | ICD-10-CM | POA: Diagnosis not present

## 2022-07-01 DIAGNOSIS — H353113 Nonexudative age-related macular degeneration, right eye, advanced atrophic without subfoveal involvement: Secondary | ICD-10-CM | POA: Diagnosis not present

## 2022-07-01 DIAGNOSIS — D3131 Benign neoplasm of right choroid: Secondary | ICD-10-CM | POA: Diagnosis not present

## 2022-07-01 DIAGNOSIS — H353124 Nonexudative age-related macular degeneration, left eye, advanced atrophic with subfoveal involvement: Secondary | ICD-10-CM | POA: Diagnosis not present

## 2022-07-01 DIAGNOSIS — H35433 Paving stone degeneration of retina, bilateral: Secondary | ICD-10-CM | POA: Diagnosis not present

## 2022-07-02 DIAGNOSIS — E7800 Pure hypercholesterolemia, unspecified: Secondary | ICD-10-CM | POA: Diagnosis not present

## 2022-07-02 DIAGNOSIS — E039 Hypothyroidism, unspecified: Secondary | ICD-10-CM | POA: Diagnosis not present

## 2022-07-02 DIAGNOSIS — I10 Essential (primary) hypertension: Secondary | ICD-10-CM | POA: Diagnosis not present

## 2022-07-02 DIAGNOSIS — D649 Anemia, unspecified: Secondary | ICD-10-CM | POA: Diagnosis not present

## 2022-07-08 DIAGNOSIS — J449 Chronic obstructive pulmonary disease, unspecified: Secondary | ICD-10-CM | POA: Diagnosis not present

## 2022-07-08 DIAGNOSIS — D649 Anemia, unspecified: Secondary | ICD-10-CM | POA: Diagnosis not present

## 2022-07-08 DIAGNOSIS — I10 Essential (primary) hypertension: Secondary | ICD-10-CM | POA: Diagnosis not present

## 2022-07-08 DIAGNOSIS — Z0000 Encounter for general adult medical examination without abnormal findings: Secondary | ICD-10-CM | POA: Diagnosis not present

## 2022-07-08 DIAGNOSIS — N183 Chronic kidney disease, stage 3 unspecified: Secondary | ICD-10-CM | POA: Diagnosis not present

## 2022-07-08 DIAGNOSIS — E039 Hypothyroidism, unspecified: Secondary | ICD-10-CM | POA: Diagnosis not present

## 2022-07-08 DIAGNOSIS — H6120 Impacted cerumen, unspecified ear: Secondary | ICD-10-CM | POA: Diagnosis not present

## 2022-07-08 DIAGNOSIS — E7800 Pure hypercholesterolemia, unspecified: Secondary | ICD-10-CM | POA: Diagnosis not present

## 2022-07-14 DIAGNOSIS — R972 Elevated prostate specific antigen [PSA]: Secondary | ICD-10-CM | POA: Diagnosis not present

## 2022-07-21 DIAGNOSIS — R972 Elevated prostate specific antigen [PSA]: Secondary | ICD-10-CM | POA: Diagnosis not present

## 2022-07-21 DIAGNOSIS — E291 Testicular hypofunction: Secondary | ICD-10-CM | POA: Diagnosis not present

## 2022-07-21 DIAGNOSIS — N401 Enlarged prostate with lower urinary tract symptoms: Secondary | ICD-10-CM | POA: Diagnosis not present

## 2022-07-21 DIAGNOSIS — H6121 Impacted cerumen, right ear: Secondary | ICD-10-CM | POA: Diagnosis not present

## 2022-07-21 DIAGNOSIS — R351 Nocturia: Secondary | ICD-10-CM | POA: Diagnosis not present

## 2022-07-21 DIAGNOSIS — N5201 Erectile dysfunction due to arterial insufficiency: Secondary | ICD-10-CM | POA: Diagnosis not present

## 2022-07-29 DIAGNOSIS — H353124 Nonexudative age-related macular degeneration, left eye, advanced atrophic with subfoveal involvement: Secondary | ICD-10-CM | POA: Diagnosis not present

## 2022-09-23 DIAGNOSIS — H5213 Myopia, bilateral: Secondary | ICD-10-CM | POA: Diagnosis not present

## 2022-09-23 DIAGNOSIS — H52203 Unspecified astigmatism, bilateral: Secondary | ICD-10-CM | POA: Diagnosis not present

## 2022-09-23 DIAGNOSIS — H353134 Nonexudative age-related macular degeneration, bilateral, advanced atrophic with subfoveal involvement: Secondary | ICD-10-CM | POA: Diagnosis not present

## 2022-09-23 DIAGNOSIS — Z961 Presence of intraocular lens: Secondary | ICD-10-CM | POA: Diagnosis not present

## 2022-09-24 DIAGNOSIS — H353133 Nonexudative age-related macular degeneration, bilateral, advanced atrophic without subfoveal involvement: Secondary | ICD-10-CM | POA: Diagnosis not present

## 2022-09-24 DIAGNOSIS — H35433 Paving stone degeneration of retina, bilateral: Secondary | ICD-10-CM | POA: Diagnosis not present

## 2022-09-24 DIAGNOSIS — D3131 Benign neoplasm of right choroid: Secondary | ICD-10-CM | POA: Diagnosis not present

## 2022-09-24 DIAGNOSIS — H43813 Vitreous degeneration, bilateral: Secondary | ICD-10-CM | POA: Diagnosis not present

## 2022-11-21 DIAGNOSIS — H353124 Nonexudative age-related macular degeneration, left eye, advanced atrophic with subfoveal involvement: Secondary | ICD-10-CM | POA: Diagnosis not present

## 2022-11-21 DIAGNOSIS — H35433 Paving stone degeneration of retina, bilateral: Secondary | ICD-10-CM | POA: Diagnosis not present

## 2022-11-21 DIAGNOSIS — D3131 Benign neoplasm of right choroid: Secondary | ICD-10-CM | POA: Diagnosis not present

## 2022-11-21 DIAGNOSIS — H353113 Nonexudative age-related macular degeneration, right eye, advanced atrophic without subfoveal involvement: Secondary | ICD-10-CM | POA: Diagnosis not present

## 2022-11-21 DIAGNOSIS — H43813 Vitreous degeneration, bilateral: Secondary | ICD-10-CM | POA: Diagnosis not present

## 2022-12-02 ENCOUNTER — Ambulatory Visit: Payer: Medicare Other | Admitting: Neurology

## 2023-01-21 DIAGNOSIS — H353113 Nonexudative age-related macular degeneration, right eye, advanced atrophic without subfoveal involvement: Secondary | ICD-10-CM | POA: Diagnosis not present

## 2023-01-21 DIAGNOSIS — D3131 Benign neoplasm of right choroid: Secondary | ICD-10-CM | POA: Diagnosis not present

## 2023-01-21 DIAGNOSIS — H353124 Nonexudative age-related macular degeneration, left eye, advanced atrophic with subfoveal involvement: Secondary | ICD-10-CM | POA: Diagnosis not present

## 2023-01-21 DIAGNOSIS — H35433 Paving stone degeneration of retina, bilateral: Secondary | ICD-10-CM | POA: Diagnosis not present

## 2023-01-21 DIAGNOSIS — H43813 Vitreous degeneration, bilateral: Secondary | ICD-10-CM | POA: Diagnosis not present

## 2023-03-17 DIAGNOSIS — R5383 Other fatigue: Secondary | ICD-10-CM | POA: Diagnosis not present

## 2023-03-17 DIAGNOSIS — Z6827 Body mass index (BMI) 27.0-27.9, adult: Secondary | ICD-10-CM | POA: Diagnosis not present

## 2023-03-17 DIAGNOSIS — D649 Anemia, unspecified: Secondary | ICD-10-CM | POA: Diagnosis not present

## 2023-03-17 DIAGNOSIS — R2689 Other abnormalities of gait and mobility: Secondary | ICD-10-CM | POA: Diagnosis not present

## 2023-03-17 DIAGNOSIS — J449 Chronic obstructive pulmonary disease, unspecified: Secondary | ICD-10-CM | POA: Diagnosis not present

## 2023-03-17 DIAGNOSIS — Z8739 Personal history of other diseases of the musculoskeletal system and connective tissue: Secondary | ICD-10-CM | POA: Diagnosis not present

## 2023-03-17 DIAGNOSIS — N183 Chronic kidney disease, stage 3 unspecified: Secondary | ICD-10-CM | POA: Diagnosis not present

## 2023-03-18 DIAGNOSIS — H353113 Nonexudative age-related macular degeneration, right eye, advanced atrophic without subfoveal involvement: Secondary | ICD-10-CM | POA: Diagnosis not present

## 2023-03-18 DIAGNOSIS — D3131 Benign neoplasm of right choroid: Secondary | ICD-10-CM | POA: Diagnosis not present

## 2023-03-18 DIAGNOSIS — H353124 Nonexudative age-related macular degeneration, left eye, advanced atrophic with subfoveal involvement: Secondary | ICD-10-CM | POA: Diagnosis not present

## 2023-03-18 DIAGNOSIS — H35433 Paving stone degeneration of retina, bilateral: Secondary | ICD-10-CM | POA: Diagnosis not present

## 2023-03-18 DIAGNOSIS — H43813 Vitreous degeneration, bilateral: Secondary | ICD-10-CM | POA: Diagnosis not present

## 2023-03-24 NOTE — Progress Notes (Signed)
Assessment/Plan:    1.  Tremor  I see no evidence of a neurodegenerative process today.  No evidence of rest, postural or intention tremor today or last visit.  Reassurance provided.   2.  MCI  -Patient with neurocognitive testing in January, 2023 demonstrating MCI, and even that was quite mild at the time.  This hasn't worsened  3.  F/u prn Subjective:   Shane Cook was seen today in follow up for tremor and memory change.  My previous records were reviewed prior to todays visit. Pt with wife who supplements hx.  Pt had neurocognitive testing done with Dr. Milbert Coulter since last visit.  This was done December 04, 2021.  There was evidence only of MCI, and Dr. Milbert Coulter noted "the mild nature of this designation should be emphasized."  In regards to tremor, when I saw the patient last visit I really did not see tremor, but we decided to follow him today (just over 1 year since her last visit) to make sure nothing neurodegenerative was developing.  Pt states that he is not noting tremor.  Wife states that she has noticed a "noticeable change for the better" in terms of tremor.  Memory has been good.    ALLERGIES:   Allergies  Allergen Reactions   Sulfa Antibiotics     Questionable allergy   Sulfamethoxazole Other (See Comments)    Family history of allergy - patient never took.   Amoxicillin Rash    Has patient had a PCN reaction causing immediate rash, facial/tongue/throat swelling, SOB or lightheadedness with hypotension: Yes Has patient had a PCN reaction causing severe rash involving mucus membranes or skin necrosis: No Has patient had a PCN reaction that required hospitalization: No Has patient had a PCN reaction occurring within the last 10 years: No If all of the above answers are "NO", then may proceed with Cephalosporin use.     CURRENT MEDICATIONS:  Current Meds  Medication Sig   acetaminophen (TYLENOL) 650 MG CR tablet Take 1,300 mg by mouth 2 (two) times daily.    atorvastatin (LIPITOR) 40 MG tablet Take 20 mg by mouth daily.   beta carotene w/minerals (OCUVITE) tablet Take 1 tablet by mouth daily.   cetirizine (ZYRTEC) 10 MG tablet Take 10 mg by mouth daily.   hydrochlorothiazide (HYDRODIURIL) 25 MG tablet Take 12.5 mg by mouth daily.    Hypertonic Nasal Wash (SINUS RINSE NA) Place 1 Dose into the nose daily as needed (congestion).   levothyroxine (SYNTHROID) 125 MCG tablet Take 137 mcg by mouth daily before breakfast.       Objective:    PHYSICAL EXAMINATION:    VITALS:   Vitals:   03/31/23 1052  BP: 120/70  Pulse: 71  SpO2: 94%  Weight: 182 lb 9.6 oz (82.8 kg)  Height: 5\' 10"  (1.778 m)    GEN:  The patient appears stated age and is in NAD. HEENT:  Normocephalic, atraumatic.  The mucous membranes are moist. The superficial temporal arteries are without ropiness or tenderness. CV:  RRR Lungs:  CTAB Neck/HEME:  There are no carotid bruits bilaterally.   Neurological examination:   Orientation: The patient is alert and oriented x3.  Cranial nerves: There is good facial symmetry. Extraocular muscles are intact.  Speech is fluent and clear. Sensation: Sensation is intact to light touch throughout. Motor: Strength is at least antigravity x 4.    Movement examination: Tone: There is nl tone in the bilateral upper extremities.  The tone  in the lower extremities is nl.  Abnormal movements: no rest tremor.  No postural tremor.  No intention tremor.   Coordination:  There is no decremation with RAM's, with any form of RAMS, including alternating supination and pronation of the forearm, hand opening and closing, finger taps, heel taps and toe taps. Gait and Station: The patient has no difficulty arising out of a deep-seated chair without the use of the hands. The patient's stride length is good with good arm swing. I have reviewed and interpreted the following labs independently   Chemistry      Component Value Date/Time   NA 140  07/01/2021 2156   NA 146 (H) 04/27/2017 1031   K 3.7 07/01/2021 2156   CL 101 07/01/2021 2156   CO2 30 07/01/2021 2156   BUN 20 07/01/2021 2156   BUN 15 04/27/2017 1031   CREATININE 1.28 (H) 07/01/2021 2156      Component Value Date/Time   CALCIUM 9.2 07/01/2021 2156   ALKPHOS 88 07/01/2021 2156   AST 24 07/01/2021 2156   ALT 21 07/01/2021 2156   BILITOT 0.9 07/01/2021 2156      Lab Results  Component Value Date   WBC 13.1 (H) 07/01/2021   HGB 14.0 07/01/2021   HCT 43.2 07/01/2021   MCV 93.9 07/01/2021   PLT 248 07/01/2021   No results found for: "TSH"   Chemistry      Component Value Date/Time   NA 140 07/01/2021 2156   NA 146 (H) 04/27/2017 1031   K 3.7 07/01/2021 2156   CL 101 07/01/2021 2156   CO2 30 07/01/2021 2156   BUN 20 07/01/2021 2156   BUN 15 04/27/2017 1031   CREATININE 1.28 (H) 07/01/2021 2156      Component Value Date/Time   CALCIUM 9.2 07/01/2021 2156   ALKPHOS 88 07/01/2021 2156   AST 24 07/01/2021 2156   ALT 21 07/01/2021 2156   BILITOT 0.9 07/01/2021 2156     Lab Results  Component Value Date   VITAMINB12 510 12/02/2021       Cc:  Daisy Floro, MD

## 2023-03-31 ENCOUNTER — Ambulatory Visit (INDEPENDENT_AMBULATORY_CARE_PROVIDER_SITE_OTHER): Payer: Medicare Other | Admitting: Neurology

## 2023-03-31 ENCOUNTER — Encounter: Payer: Self-pay | Admitting: Neurology

## 2023-03-31 VITALS — BP 120/70 | HR 71 | Ht 70.0 in | Wt 182.6 lb

## 2023-03-31 DIAGNOSIS — R251 Tremor, unspecified: Secondary | ICD-10-CM | POA: Diagnosis not present

## 2023-03-31 DIAGNOSIS — G3184 Mild cognitive impairment, so stated: Secondary | ICD-10-CM | POA: Diagnosis not present

## 2023-03-31 NOTE — Patient Instructions (Signed)
The physicians and staff at [facility] are committed to providing excellent care. You may receive a survey requesting feedback about your experience at our office. We strive to receive "very good" responses to the survey questions. If you feel that your experience would prevent you from giving the office a "very good " response, please contact our office to try to remedy the situation. We may be reached at [phone]. Thank you for taking the time out of your busy day to complete the survey.  

## 2023-05-19 DIAGNOSIS — D3131 Benign neoplasm of right choroid: Secondary | ICD-10-CM | POA: Diagnosis not present

## 2023-05-19 DIAGNOSIS — H35433 Paving stone degeneration of retina, bilateral: Secondary | ICD-10-CM | POA: Diagnosis not present

## 2023-05-19 DIAGNOSIS — H353124 Nonexudative age-related macular degeneration, left eye, advanced atrophic with subfoveal involvement: Secondary | ICD-10-CM | POA: Diagnosis not present

## 2023-05-19 DIAGNOSIS — H353113 Nonexudative age-related macular degeneration, right eye, advanced atrophic without subfoveal involvement: Secondary | ICD-10-CM | POA: Diagnosis not present

## 2023-05-19 DIAGNOSIS — H43813 Vitreous degeneration, bilateral: Secondary | ICD-10-CM | POA: Diagnosis not present

## 2023-06-18 DIAGNOSIS — N5201 Erectile dysfunction due to arterial insufficiency: Secondary | ICD-10-CM | POA: Diagnosis not present

## 2023-06-18 DIAGNOSIS — R351 Nocturia: Secondary | ICD-10-CM | POA: Diagnosis not present

## 2023-06-18 DIAGNOSIS — R972 Elevated prostate specific antigen [PSA]: Secondary | ICD-10-CM | POA: Diagnosis not present

## 2023-06-18 DIAGNOSIS — N401 Enlarged prostate with lower urinary tract symptoms: Secondary | ICD-10-CM | POA: Diagnosis not present

## 2023-07-08 DIAGNOSIS — E7800 Pure hypercholesterolemia, unspecified: Secondary | ICD-10-CM | POA: Diagnosis not present

## 2023-07-08 DIAGNOSIS — E039 Hypothyroidism, unspecified: Secondary | ICD-10-CM | POA: Diagnosis not present

## 2023-07-08 DIAGNOSIS — D649 Anemia, unspecified: Secondary | ICD-10-CM | POA: Diagnosis not present

## 2023-07-08 DIAGNOSIS — I10 Essential (primary) hypertension: Secondary | ICD-10-CM | POA: Diagnosis not present

## 2023-07-14 DIAGNOSIS — E7800 Pure hypercholesterolemia, unspecified: Secondary | ICD-10-CM | POA: Diagnosis not present

## 2023-07-14 DIAGNOSIS — H6121 Impacted cerumen, right ear: Secondary | ICD-10-CM | POA: Diagnosis not present

## 2023-07-14 DIAGNOSIS — R2689 Other abnormalities of gait and mobility: Secondary | ICD-10-CM | POA: Diagnosis not present

## 2023-07-14 DIAGNOSIS — N183 Chronic kidney disease, stage 3 unspecified: Secondary | ICD-10-CM | POA: Diagnosis not present

## 2023-07-14 DIAGNOSIS — Z0000 Encounter for general adult medical examination without abnormal findings: Secondary | ICD-10-CM | POA: Diagnosis not present

## 2023-07-14 DIAGNOSIS — Z6825 Body mass index (BMI) 25.0-25.9, adult: Secondary | ICD-10-CM | POA: Diagnosis not present

## 2023-07-14 DIAGNOSIS — I10 Essential (primary) hypertension: Secondary | ICD-10-CM | POA: Diagnosis not present

## 2023-07-14 DIAGNOSIS — R7301 Impaired fasting glucose: Secondary | ICD-10-CM | POA: Diagnosis not present

## 2023-07-14 DIAGNOSIS — E663 Overweight: Secondary | ICD-10-CM | POA: Diagnosis not present

## 2023-07-14 DIAGNOSIS — D649 Anemia, unspecified: Secondary | ICD-10-CM | POA: Diagnosis not present

## 2023-07-14 DIAGNOSIS — J449 Chronic obstructive pulmonary disease, unspecified: Secondary | ICD-10-CM | POA: Diagnosis not present

## 2023-07-14 DIAGNOSIS — E039 Hypothyroidism, unspecified: Secondary | ICD-10-CM | POA: Diagnosis not present

## 2023-07-14 DIAGNOSIS — B351 Tinea unguium: Secondary | ICD-10-CM | POA: Diagnosis not present

## 2023-07-15 DIAGNOSIS — H353133 Nonexudative age-related macular degeneration, bilateral, advanced atrophic without subfoveal involvement: Secondary | ICD-10-CM | POA: Diagnosis not present

## 2023-07-15 DIAGNOSIS — H43813 Vitreous degeneration, bilateral: Secondary | ICD-10-CM | POA: Diagnosis not present

## 2023-07-15 DIAGNOSIS — H35433 Paving stone degeneration of retina, bilateral: Secondary | ICD-10-CM | POA: Diagnosis not present

## 2023-07-15 DIAGNOSIS — D3131 Benign neoplasm of right choroid: Secondary | ICD-10-CM | POA: Diagnosis not present

## 2023-07-15 DIAGNOSIS — H353113 Nonexudative age-related macular degeneration, right eye, advanced atrophic without subfoveal involvement: Secondary | ICD-10-CM | POA: Diagnosis not present

## 2023-07-15 DIAGNOSIS — H353123 Nonexudative age-related macular degeneration, left eye, advanced atrophic without subfoveal involvement: Secondary | ICD-10-CM | POA: Diagnosis not present

## 2023-08-03 DIAGNOSIS — Z23 Encounter for immunization: Secondary | ICD-10-CM | POA: Diagnosis not present

## 2023-08-05 DIAGNOSIS — Z6825 Body mass index (BMI) 25.0-25.9, adult: Secondary | ICD-10-CM | POA: Diagnosis not present

## 2023-08-05 DIAGNOSIS — H6123 Impacted cerumen, bilateral: Secondary | ICD-10-CM | POA: Diagnosis not present

## 2023-09-09 DIAGNOSIS — H353114 Nonexudative age-related macular degeneration, right eye, advanced atrophic with subfoveal involvement: Secondary | ICD-10-CM | POA: Diagnosis not present

## 2023-09-09 DIAGNOSIS — H353124 Nonexudative age-related macular degeneration, left eye, advanced atrophic with subfoveal involvement: Secondary | ICD-10-CM | POA: Diagnosis not present

## 2023-09-09 DIAGNOSIS — D3131 Benign neoplasm of right choroid: Secondary | ICD-10-CM | POA: Diagnosis not present

## 2023-09-09 DIAGNOSIS — H43813 Vitreous degeneration, bilateral: Secondary | ICD-10-CM | POA: Diagnosis not present

## 2023-09-09 DIAGNOSIS — H35433 Paving stone degeneration of retina, bilateral: Secondary | ICD-10-CM | POA: Diagnosis not present

## 2023-09-09 DIAGNOSIS — H353134 Nonexudative age-related macular degeneration, bilateral, advanced atrophic with subfoveal involvement: Secondary | ICD-10-CM | POA: Diagnosis not present

## 2023-11-04 DIAGNOSIS — D3131 Benign neoplasm of right choroid: Secondary | ICD-10-CM | POA: Diagnosis not present

## 2023-11-04 DIAGNOSIS — H353134 Nonexudative age-related macular degeneration, bilateral, advanced atrophic with subfoveal involvement: Secondary | ICD-10-CM | POA: Diagnosis not present

## 2023-11-04 DIAGNOSIS — H43813 Vitreous degeneration, bilateral: Secondary | ICD-10-CM | POA: Diagnosis not present

## 2023-11-04 DIAGNOSIS — H35433 Paving stone degeneration of retina, bilateral: Secondary | ICD-10-CM | POA: Diagnosis not present

## 2023-11-29 DIAGNOSIS — U071 COVID-19: Secondary | ICD-10-CM | POA: Diagnosis not present

## 2023-12-15 DIAGNOSIS — H53413 Scotoma involving central area, bilateral: Secondary | ICD-10-CM | POA: Diagnosis not present

## 2023-12-17 ENCOUNTER — Other Ambulatory Visit (HOSPITAL_COMMUNITY): Payer: Self-pay

## 2023-12-17 ENCOUNTER — Telehealth (HOSPITAL_COMMUNITY): Payer: Self-pay | Admitting: Pharmacy Technician

## 2023-12-17 MED ORDER — HYDROCHLOROTHIAZIDE 25 MG PO TABS
25.0000 mg | ORAL_TABLET | Freq: Every morning | ORAL | 4 refills | Status: AC
  Filled 2023-12-17: qty 90, 90d supply, fill #0
  Filled 2024-03-14: qty 90, 90d supply, fill #1
  Filled 2024-06-30: qty 90, 90d supply, fill #2

## 2023-12-17 NOTE — Telephone Encounter (Signed)
Advanced Heart Failure Patient Advocate Encounter  Spoke with patient regarding his wifes medications. He wants to also get his medications filled through our mail order pharmacy. Requested profile transfer to [facility]) from [facility] to [facility] for mailing. CC information provided to pharmacy. Called and spoke with Vernona Rieger (daughter in law) to update, per patients request.   Archer Asa, CPhT

## 2023-12-18 ENCOUNTER — Other Ambulatory Visit (HOSPITAL_COMMUNITY): Payer: Self-pay

## 2023-12-18 ENCOUNTER — Other Ambulatory Visit: Payer: Self-pay

## 2023-12-18 MED ORDER — TERBINAFINE HCL 250 MG PO TABS: 250.0000 mg | ORAL_TABLET | Freq: Every day | ORAL | 2 refills | Status: AC

## 2023-12-18 MED ORDER — ATORVASTATIN CALCIUM 20 MG PO TABS
20.0000 mg | ORAL_TABLET | Freq: Every day | ORAL | 4 refills | Status: AC
  Filled 2023-12-18: qty 90, 90d supply, fill #0
  Filled 2024-03-29: qty 90, 90d supply, fill #1
  Filled 2024-06-30: qty 90, 90d supply, fill #2

## 2023-12-18 MED ORDER — TOBRAMYCIN 0.3 % OP SOLN
1.0000 [drp] | Freq: Four times a day (QID) | OPHTHALMIC | 3 refills | Status: AC
  Filled 2023-12-18: qty 5, 25d supply, fill #0
  Filled 2024-02-25: qty 5, 25d supply, fill #1
  Filled 2024-03-29: qty 5, 25d supply, fill #2
  Filled 2024-09-28: qty 5, 25d supply, fill #3

## 2023-12-18 MED ORDER — LEVOTHYROXINE SODIUM 175 MCG PO TABS
175.0000 ug | ORAL_TABLET | Freq: Every day | ORAL | 4 refills | Status: AC
  Filled 2023-12-18: qty 90, 90d supply, fill #0
  Filled 2024-03-29: qty 90, 90d supply, fill #1
  Filled 2024-06-30: qty 90, 90d supply, fill #2

## 2023-12-18 MED ORDER — NIRMATRELVIR&RITONAVIR 300/100 20 X 150 MG & 10 X 100MG PO TBPK: 3.0000 | ORAL_TABLET | Freq: Two times a day (BID) | ORAL | 0 refills | Status: AC

## 2023-12-21 ENCOUNTER — Other Ambulatory Visit: Payer: Self-pay

## 2023-12-21 ENCOUNTER — Other Ambulatory Visit (HOSPITAL_COMMUNITY): Payer: Self-pay

## 2023-12-24 ENCOUNTER — Other Ambulatory Visit (HOSPITAL_COMMUNITY): Payer: Self-pay

## 2023-12-30 DIAGNOSIS — D3131 Benign neoplasm of right choroid: Secondary | ICD-10-CM | POA: Diagnosis not present

## 2023-12-30 DIAGNOSIS — H43813 Vitreous degeneration, bilateral: Secondary | ICD-10-CM | POA: Diagnosis not present

## 2023-12-30 DIAGNOSIS — H35433 Paving stone degeneration of retina, bilateral: Secondary | ICD-10-CM | POA: Diagnosis not present

## 2023-12-30 DIAGNOSIS — H353134 Nonexudative age-related macular degeneration, bilateral, advanced atrophic with subfoveal involvement: Secondary | ICD-10-CM | POA: Diagnosis not present

## 2024-01-12 DIAGNOSIS — H53413 Scotoma involving central area, bilateral: Secondary | ICD-10-CM | POA: Diagnosis not present

## 2024-01-26 DIAGNOSIS — H53413 Scotoma involving central area, bilateral: Secondary | ICD-10-CM | POA: Diagnosis not present

## 2024-02-24 DIAGNOSIS — H35433 Paving stone degeneration of retina, bilateral: Secondary | ICD-10-CM | POA: Diagnosis not present

## 2024-02-24 DIAGNOSIS — D3131 Benign neoplasm of right choroid: Secondary | ICD-10-CM | POA: Diagnosis not present

## 2024-02-24 DIAGNOSIS — H43813 Vitreous degeneration, bilateral: Secondary | ICD-10-CM | POA: Diagnosis not present

## 2024-02-24 DIAGNOSIS — H353134 Nonexudative age-related macular degeneration, bilateral, advanced atrophic with subfoveal involvement: Secondary | ICD-10-CM | POA: Diagnosis not present

## 2024-02-25 ENCOUNTER — Other Ambulatory Visit (HOSPITAL_COMMUNITY): Payer: Self-pay

## 2024-03-14 ENCOUNTER — Other Ambulatory Visit: Payer: Self-pay

## 2024-03-14 ENCOUNTER — Other Ambulatory Visit (HOSPITAL_COMMUNITY): Payer: Self-pay

## 2024-03-29 ENCOUNTER — Other Ambulatory Visit (HOSPITAL_COMMUNITY): Payer: Self-pay

## 2024-03-29 ENCOUNTER — Other Ambulatory Visit: Payer: Self-pay

## 2024-04-20 DIAGNOSIS — H35433 Paving stone degeneration of retina, bilateral: Secondary | ICD-10-CM | POA: Diagnosis not present

## 2024-04-20 DIAGNOSIS — H353134 Nonexudative age-related macular degeneration, bilateral, advanced atrophic with subfoveal involvement: Secondary | ICD-10-CM | POA: Diagnosis not present

## 2024-04-20 DIAGNOSIS — H43813 Vitreous degeneration, bilateral: Secondary | ICD-10-CM | POA: Diagnosis not present

## 2024-06-15 DIAGNOSIS — D3131 Benign neoplasm of right choroid: Secondary | ICD-10-CM | POA: Diagnosis not present

## 2024-06-15 DIAGNOSIS — H43813 Vitreous degeneration, bilateral: Secondary | ICD-10-CM | POA: Diagnosis not present

## 2024-06-15 DIAGNOSIS — H35433 Paving stone degeneration of retina, bilateral: Secondary | ICD-10-CM | POA: Diagnosis not present

## 2024-06-15 DIAGNOSIS — H353134 Nonexudative age-related macular degeneration, bilateral, advanced atrophic with subfoveal involvement: Secondary | ICD-10-CM | POA: Diagnosis not present

## 2024-06-30 ENCOUNTER — Other Ambulatory Visit: Payer: Self-pay

## 2024-06-30 ENCOUNTER — Other Ambulatory Visit (HOSPITAL_COMMUNITY): Payer: Self-pay

## 2024-07-01 ENCOUNTER — Encounter: Payer: Self-pay | Admitting: Pharmacist

## 2024-07-01 ENCOUNTER — Other Ambulatory Visit (HOSPITAL_COMMUNITY): Payer: Self-pay

## 2024-07-01 ENCOUNTER — Other Ambulatory Visit: Payer: Self-pay

## 2024-07-14 DIAGNOSIS — E039 Hypothyroidism, unspecified: Secondary | ICD-10-CM | POA: Diagnosis not present

## 2024-07-14 DIAGNOSIS — R7301 Impaired fasting glucose: Secondary | ICD-10-CM | POA: Diagnosis not present

## 2024-07-14 DIAGNOSIS — I10 Essential (primary) hypertension: Secondary | ICD-10-CM | POA: Diagnosis not present

## 2024-07-14 DIAGNOSIS — D649 Anemia, unspecified: Secondary | ICD-10-CM | POA: Diagnosis not present

## 2024-07-14 DIAGNOSIS — E7800 Pure hypercholesterolemia, unspecified: Secondary | ICD-10-CM | POA: Diagnosis not present

## 2024-07-14 DIAGNOSIS — Z23 Encounter for immunization: Secondary | ICD-10-CM | POA: Diagnosis not present

## 2024-07-14 DIAGNOSIS — Z0000 Encounter for general adult medical examination without abnormal findings: Secondary | ICD-10-CM | POA: Diagnosis not present

## 2024-07-14 DIAGNOSIS — Z1331 Encounter for screening for depression: Secondary | ICD-10-CM | POA: Diagnosis not present

## 2024-07-19 ENCOUNTER — Other Ambulatory Visit (HOSPITAL_COMMUNITY): Payer: Self-pay

## 2024-07-19 ENCOUNTER — Other Ambulatory Visit: Payer: Self-pay

## 2024-07-19 DIAGNOSIS — D649 Anemia, unspecified: Secondary | ICD-10-CM | POA: Diagnosis not present

## 2024-07-19 DIAGNOSIS — E039 Hypothyroidism, unspecified: Secondary | ICD-10-CM | POA: Diagnosis not present

## 2024-07-19 DIAGNOSIS — R7303 Prediabetes: Secondary | ICD-10-CM | POA: Diagnosis not present

## 2024-07-19 DIAGNOSIS — R351 Nocturia: Secondary | ICD-10-CM | POA: Diagnosis not present

## 2024-07-19 DIAGNOSIS — N401 Enlarged prostate with lower urinary tract symptoms: Secondary | ICD-10-CM | POA: Diagnosis not present

## 2024-07-19 DIAGNOSIS — E7800 Pure hypercholesterolemia, unspecified: Secondary | ICD-10-CM | POA: Diagnosis not present

## 2024-07-19 DIAGNOSIS — Z6825 Body mass index (BMI) 25.0-25.9, adult: Secondary | ICD-10-CM | POA: Diagnosis not present

## 2024-07-19 DIAGNOSIS — I10 Essential (primary) hypertension: Secondary | ICD-10-CM | POA: Diagnosis not present

## 2024-07-19 DIAGNOSIS — J449 Chronic obstructive pulmonary disease, unspecified: Secondary | ICD-10-CM | POA: Diagnosis not present

## 2024-07-19 DIAGNOSIS — N183 Chronic kidney disease, stage 3 unspecified: Secondary | ICD-10-CM | POA: Diagnosis not present

## 2024-07-19 MED ORDER — ATORVASTATIN CALCIUM 20 MG PO TABS
20.0000 mg | ORAL_TABLET | Freq: Every day | ORAL | 4 refills | Status: AC
  Filled 2024-09-28: qty 90, 90d supply, fill #0
  Filled 2024-12-23: qty 90, 90d supply, fill #1

## 2024-07-19 MED ORDER — ALBUTEROL SULFATE HFA 108 (90 BASE) MCG/ACT IN AERS
1.0000 | INHALATION_SPRAY | RESPIRATORY_TRACT | 3 refills | Status: AC
  Filled 2024-07-19: qty 6.7, 17d supply, fill #0

## 2024-07-19 MED ORDER — HYDROCHLOROTHIAZIDE 12.5 MG PO TABS
12.5000 mg | ORAL_TABLET | ORAL | 4 refills | Status: AC
  Filled 2024-07-19: qty 90, 90d supply, fill #0
  Filled 2024-09-28 – 2024-10-25 (×2): qty 90, 90d supply, fill #1

## 2024-07-19 MED ORDER — LEVOTHYROXINE SODIUM 150 MCG PO TABS
150.0000 ug | ORAL_TABLET | ORAL | 4 refills | Status: AC
  Filled 2024-07-19: qty 90, 90d supply, fill #0
  Filled 2024-09-28 – 2024-10-25 (×2): qty 90, 90d supply, fill #1

## 2024-08-08 DIAGNOSIS — D225 Melanocytic nevi of trunk: Secondary | ICD-10-CM | POA: Diagnosis not present

## 2024-08-08 DIAGNOSIS — L821 Other seborrheic keratosis: Secondary | ICD-10-CM | POA: Diagnosis not present

## 2024-08-08 DIAGNOSIS — L814 Other melanin hyperpigmentation: Secondary | ICD-10-CM | POA: Diagnosis not present

## 2024-08-10 DIAGNOSIS — H35433 Paving stone degeneration of retina, bilateral: Secondary | ICD-10-CM | POA: Diagnosis not present

## 2024-08-10 DIAGNOSIS — H43813 Vitreous degeneration, bilateral: Secondary | ICD-10-CM | POA: Diagnosis not present

## 2024-08-10 DIAGNOSIS — H353134 Nonexudative age-related macular degeneration, bilateral, advanced atrophic with subfoveal involvement: Secondary | ICD-10-CM | POA: Diagnosis not present

## 2024-08-10 DIAGNOSIS — D3131 Benign neoplasm of right choroid: Secondary | ICD-10-CM | POA: Diagnosis not present

## 2024-09-28 ENCOUNTER — Other Ambulatory Visit (HOSPITAL_COMMUNITY): Payer: Self-pay

## 2024-09-28 ENCOUNTER — Other Ambulatory Visit: Payer: Self-pay

## 2024-09-28 DIAGNOSIS — Z6825 Body mass index (BMI) 25.0-25.9, adult: Secondary | ICD-10-CM | POA: Diagnosis not present

## 2024-09-28 DIAGNOSIS — R634 Abnormal weight loss: Secondary | ICD-10-CM | POA: Diagnosis not present

## 2024-09-28 DIAGNOSIS — Z23 Encounter for immunization: Secondary | ICD-10-CM | POA: Diagnosis not present

## 2024-09-28 DIAGNOSIS — I10 Essential (primary) hypertension: Secondary | ICD-10-CM | POA: Diagnosis not present

## 2024-09-28 DIAGNOSIS — N183 Chronic kidney disease, stage 3 unspecified: Secondary | ICD-10-CM | POA: Diagnosis not present

## 2024-09-28 DIAGNOSIS — E039 Hypothyroidism, unspecified: Secondary | ICD-10-CM | POA: Diagnosis not present

## 2024-10-05 DIAGNOSIS — D3131 Benign neoplasm of right choroid: Secondary | ICD-10-CM | POA: Diagnosis not present

## 2024-10-05 DIAGNOSIS — H353134 Nonexudative age-related macular degeneration, bilateral, advanced atrophic with subfoveal involvement: Secondary | ICD-10-CM | POA: Diagnosis not present

## 2024-10-05 DIAGNOSIS — H35433 Paving stone degeneration of retina, bilateral: Secondary | ICD-10-CM | POA: Diagnosis not present

## 2024-10-05 DIAGNOSIS — H43393 Other vitreous opacities, bilateral: Secondary | ICD-10-CM | POA: Diagnosis not present

## 2024-10-05 DIAGNOSIS — H43813 Vitreous degeneration, bilateral: Secondary | ICD-10-CM | POA: Diagnosis not present

## 2024-10-25 ENCOUNTER — Other Ambulatory Visit: Payer: Self-pay

## 2024-10-25 ENCOUNTER — Other Ambulatory Visit (HOSPITAL_COMMUNITY): Payer: Self-pay

## 2024-12-23 ENCOUNTER — Other Ambulatory Visit: Payer: Self-pay
